# Patient Record
Sex: Male | Born: 1985 | Race: Black or African American | Hispanic: No | Marital: Single | Smoking: Current every day smoker
Health system: Southern US, Community
[De-identification: ages and names within clinical notes are randomized; demographics above are authoritative.]

## PROBLEM LIST (undated history)

## (undated) DIAGNOSIS — R569 Unspecified convulsions: Secondary | ICD-10-CM

## (undated) DIAGNOSIS — K7460 Unspecified cirrhosis of liver: Secondary | ICD-10-CM

## (undated) HISTORY — PX: FRACTURE SURGERY: SHX138

---

## 2005-05-24 ENCOUNTER — Emergency Department: Payer: Self-pay | Admitting: Internal Medicine

## 2005-10-09 ENCOUNTER — Emergency Department: Payer: Self-pay | Admitting: Emergency Medicine

## 2006-07-10 ENCOUNTER — Emergency Department: Payer: Self-pay | Admitting: Emergency Medicine

## 2006-09-27 ENCOUNTER — Emergency Department: Payer: Self-pay

## 2006-11-21 ENCOUNTER — Other Ambulatory Visit: Payer: Self-pay

## 2006-11-21 ENCOUNTER — Emergency Department: Payer: Self-pay | Admitting: General Practice

## 2007-03-31 ENCOUNTER — Emergency Department: Payer: Self-pay | Admitting: Emergency Medicine

## 2007-08-06 ENCOUNTER — Emergency Department: Payer: Self-pay | Admitting: Emergency Medicine

## 2007-08-12 ENCOUNTER — Ambulatory Visit: Payer: Self-pay | Admitting: Internal Medicine

## 2007-11-03 ENCOUNTER — Ambulatory Visit: Payer: Self-pay | Admitting: Internal Medicine

## 2008-07-20 ENCOUNTER — Emergency Department: Payer: Self-pay | Admitting: Emergency Medicine

## 2008-08-12 ENCOUNTER — Emergency Department: Payer: Self-pay | Admitting: Internal Medicine

## 2009-02-09 ENCOUNTER — Ambulatory Visit: Payer: Self-pay | Admitting: Internal Medicine

## 2009-03-15 ENCOUNTER — Ambulatory Visit: Payer: Self-pay | Admitting: Internal Medicine

## 2009-05-26 ENCOUNTER — Ambulatory Visit: Payer: Self-pay | Admitting: Internal Medicine

## 2009-12-21 ENCOUNTER — Emergency Department: Payer: Self-pay | Admitting: Emergency Medicine

## 2012-04-08 ENCOUNTER — Emergency Department: Payer: Self-pay | Admitting: Emergency Medicine

## 2014-10-04 ENCOUNTER — Encounter (HOSPITAL_COMMUNITY): Payer: Self-pay

## 2014-10-04 ENCOUNTER — Emergency Department (HOSPITAL_COMMUNITY)
Admission: EM | Admit: 2014-10-04 | Discharge: 2014-10-04 | Disposition: A | Discharge status: Home / Self Care | Payer: Medicaid Other | Attending: Emergency Medicine | Admitting: Emergency Medicine

## 2014-10-04 ENCOUNTER — Emergency Department (HOSPITAL_COMMUNITY): Payer: Medicaid Other

## 2014-10-04 DIAGNOSIS — R52 Pain, unspecified: Secondary | ICD-10-CM

## 2014-10-04 DIAGNOSIS — S60011A Contusion of right thumb without damage to nail, initial encounter: Secondary | ICD-10-CM | POA: Insufficient documentation

## 2014-10-04 DIAGNOSIS — Y998 Other external cause status: Secondary | ICD-10-CM | POA: Diagnosis not present

## 2014-10-04 DIAGNOSIS — S6991XA Unspecified injury of right wrist, hand and finger(s), initial encounter: Secondary | ICD-10-CM | POA: Diagnosis present

## 2014-10-04 DIAGNOSIS — Z791 Long term (current) use of non-steroidal anti-inflammatories (NSAID): Secondary | ICD-10-CM | POA: Diagnosis not present

## 2014-10-04 DIAGNOSIS — Y9289 Other specified places as the place of occurrence of the external cause: Secondary | ICD-10-CM | POA: Diagnosis not present

## 2014-10-04 DIAGNOSIS — Y9389 Activity, other specified: Secondary | ICD-10-CM | POA: Insufficient documentation

## 2014-10-04 DIAGNOSIS — Z8669 Personal history of other diseases of the nervous system and sense organs: Secondary | ICD-10-CM | POA: Diagnosis not present

## 2014-10-04 DIAGNOSIS — Z720 Tobacco use: Secondary | ICD-10-CM | POA: Diagnosis not present

## 2014-10-04 HISTORY — DX: Unspecified convulsions: R56.9

## 2014-10-04 MED ORDER — NAPROXEN 500 MG PO TABS: 500.0000 mg | ORAL_TABLET | Freq: Two times a day (BID) | ORAL | Status: DC

## 2014-10-04 MED ORDER — NAPROXEN 250 MG PO TABS: 500.0000 mg | ORAL_TABLET | Freq: Once | ORAL | Status: DC
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
## 2014-10-04 NOTE — ED Notes (Signed)
PA at bedside.
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
## 2014-10-04 NOTE — Discharge Instructions (Signed)
Contusion °A contusion is a deep bruise. Contusions are the result of an injury that caused bleeding under the skin. The contusion may turn blue, purple, or yellow. Minor injuries will give you a painless contusion, but more severe contusions may stay painful and swollen for a few weeks.  °CAUSES  °A contusion is usually caused by a blow, trauma, or direct force to an area of the body. °SYMPTOMS  °· Swelling and redness of the injured area. °· Bruising of the injured area. °· Tenderness and soreness of the injured area. °· Pain. °DIAGNOSIS  °The diagnosis can be made by taking a history and physical exam. An X-ray, CT scan, or MRI may be needed to determine if there were any associated injuries, such as fractures. °TREATMENT  °Specific treatment will depend on what area of the body was injured. In general, the best treatment for a contusion is resting, icing, elevating, and applying cold compresses to the injured area. Over-the-counter medicines may also be recommended for pain control. Ask your caregiver what the best treatment is for your contusion. °HOME CARE INSTRUCTIONS  °· Put ice on the injured area. °¨ Put ice in a plastic bag. °¨ Place a towel between your skin and the bag. °¨ Leave the ice on for 15-20 minutes, 3-4 times a day, or as directed by your health care provider. °· Only take over-the-counter or prescription medicines for pain, discomfort, or fever as directed by your caregiver. Your caregiver may recommend avoiding anti-inflammatory medicines (aspirin, ibuprofen, and naproxen) for 48 hours because these medicines may increase bruising. °· Rest the injured area. °· If possible, elevate the injured area to reduce swelling. °SEEK IMMEDIATE MEDICAL CARE IF:  °· You have increased bruising or swelling. °· You have pain that is getting worse. °· Your swelling or pain is not relieved with medicines. °MAKE SURE YOU:  °· Understand these instructions. °· Will watch your condition. °· Will get help right  away if you are not doing well or get worse. °Document Released: 07/24/2005 Document Revised: 10/19/2013 Document Reviewed: 08/19/2011 °ExitCare® Patient Information ©2015 [employer]. This information is not intended to replace advice given to you by your health care provider. Make sure you discuss any questions you have with your health care provider. ° °

## 2014-10-07 ENCOUNTER — Telehealth: Payer: Self-pay | Admitting: Orthopedic Surgery

## 2014-10-07 NOTE — Telephone Encounter (Signed)
Patient had called (10/06/14 p.m) and requested appointment following [facility] for a hand (thumb)injury.  Per review of insurance, patient requires referral from primary care provider -- [facility] card [facility].  Relayed this information to patient, and he was unaware of this office as his primary care.  We have followed up and verified that this is the practice on his insurance card, and have been given a "1-time referral" by their office until he is seen there as a patient, or is assigned by Medicaid to another provider.  Relayed this information to patient, although he did not voice understanding.  He has been scheduled for this problem accordingly, and is aware of appointment.

## 2014-10-09 ENCOUNTER — Encounter (HOSPITAL_COMMUNITY): Payer: Self-pay | Admitting: Emergency Medicine

## 2014-10-09 ENCOUNTER — Emergency Department (HOSPITAL_COMMUNITY)
Admission: EM | Admit: 2014-10-09 | Discharge: 2014-10-09 | Disposition: A | Discharge status: Home / Self Care | Payer: Medicaid Other | Attending: Emergency Medicine | Admitting: Emergency Medicine

## 2014-10-09 DIAGNOSIS — Z792 Long term (current) use of antibiotics: Secondary | ICD-10-CM | POA: Diagnosis not present

## 2014-10-09 DIAGNOSIS — K029 Dental caries, unspecified: Secondary | ICD-10-CM | POA: Insufficient documentation

## 2014-10-09 DIAGNOSIS — K088 Other specified disorders of teeth and supporting structures: Secondary | ICD-10-CM | POA: Diagnosis present

## 2014-10-09 DIAGNOSIS — Z791 Long term (current) use of non-steroidal anti-inflammatories (NSAID): Secondary | ICD-10-CM | POA: Insufficient documentation

## 2014-10-09 DIAGNOSIS — Z8669 Personal history of other diseases of the nervous system and sense organs: Secondary | ICD-10-CM | POA: Insufficient documentation

## 2014-10-09 DIAGNOSIS — Z720 Tobacco use: Secondary | ICD-10-CM | POA: Insufficient documentation

## 2014-10-09 MED ORDER — AMOXICILLIN 500 MG PO CAPS: 500.0000 mg | ORAL_CAPSULE | Freq: Three times a day (TID) | ORAL | Status: DC

## 2014-10-09 MED ORDER — TRAMADOL HCL 50 MG PO TABS: 50.0000 mg | ORAL_TABLET | Freq: Four times a day (QID) | ORAL | Status: DC | PRN

## 2014-10-09 NOTE — ED Provider Notes (Signed)
CSN: [id number]     Arrival date & time 10/09/14  1228 History  This chart was scribed for non-physician practitioner, Kerrie BuffaloHope Demani Mcbrien, PA-C,working with Vida RollerBrian D Miller, MD, by Karle PlumberJennifer Tensley, ED Scribe. This patient was seen in room APFT23/APFT23 and the patient's care was started at 2:09 PM.  Chief Complaint  Patient presents with  . Dental Pain   The history is provided by the patient. No language interpreter was used.    HPI Comments:  Lorenz CoasterJames A Fleishman is a 28 y.o. male who presents to the Emergency Department complaining of severe lower, right dental pain that has been ongoing for the past year. He reports the pain became worse yesterday. He has been applying Orajel with no significant relief of the pain. Eating makes the pain worse. Denies alleviating factors. Denies nausea, vomiting, abdominal pain, fever, chills or inability to swallow. PMH of seizures. Reports allergy to Ibuprofen with reaction of rash.  Past Medical History  Diagnosis Date  . Seizures    History reviewed. No pertinent past surgical history. History reviewed. No pertinent family history. History  Substance Use Topics  . Smoking status: Current Every Day Smoker -- 0.50 packs/day for 14 years    Types: Cigarettes  . Smokeless tobacco: Never Used  . Alcohol Use: Yes     Comment: occ    Review of Systems  Constitutional: Negative for fever and chills.  HENT: Positive for dental problem and facial swelling. Negative for trouble swallowing.   Gastrointestinal: Negative for nausea, vomiting and abdominal pain.  All other systems reviewed and are negative.   Allergies  Ibuprofen  Home Medications   Prior to Admission medications   Medication Sig Start Date End Date Taking? Authorizing Provider  amoxicillin (AMOXIL) 500 MG capsule Take 1 capsule (500 mg total) by mouth 3 (three) times daily. 10/09/14   Shilo Pauwels Orlene OchM Keaundra Stehle, NP  naproxen (NAPROSYN) 500 MG tablet Take 1 tablet (500 mg total) by mouth 2 (two) times  daily. Patient not taking: Reported on 10/09/2014 10/04/14   Burgess AmorJulie Idol, PA-C  traMADol (ULTRAM) 50 MG tablet Take 1 tablet (50 mg total) by mouth every 6 (six) hours as needed. 10/09/14   Lismary Kiehn Orlene OchM Tyla Burgner, NP   Triage Vitals: BP 123/74 mmHg  Pulse 64  Temp(Src) 98.7 F (37.1 C) (Oral)  Resp 14  Ht 5\' 6"  (1.676 m)  Wt 161 lb 6.4 oz (73.211 kg)  BMI 26.06 kg/m2  SpO2 100% Physical Exam  Constitutional: He is oriented to person, place, and time. He appears well-developed and well-nourished.  HENT:  Head: Atraumatic.  Mouth/Throat: Uvula is midline, oropharynx is clear and moist and mucous membranes are normal.  First right lower molar decayed to gumline. Surrounding erythema and edema to gingiva. Surrounding facial swelling.   Eyes: EOM are normal.  Neck: Neck supple.  Cardiovascular: Normal rate, regular rhythm and normal heart sounds.  Exam reveals no gallop and no friction rub.   No murmur heard. Pulmonary/Chest: Effort normal and breath sounds normal. No respiratory distress. He has no wheezes. He has no rales. He exhibits no tenderness.  Abdominal: Soft. Bowel sounds are normal. There is no tenderness.  Musculoskeletal: Normal range of motion.  Lymphadenopathy:    He has cervical adenopathy.  Neurological: He is alert and oriented to person, place, and time. No cranial nerve deficit.  Skin: Skin is warm and dry.  Nursing note and vitals reviewed.   ED Course  Procedures (including critical care time) DIAGNOSTIC STUDIES: Oxygen Saturation is  100% on RA, normal by my interpretation.   COORDINATION OF CARE: 2:12 PM- Will prescribe Tramadol and Amoxicillin. Will provide informatin about dental clinic. Pt verbalizes understanding and agrees to plan.  Medications - No data to display  Labs Review  MDM  28 y.o. male with dental pain that has been off and on for the past year. Stable for discharge without signs of sepsis. Discussed with the patient need for follow up with a  dentist as soon as possible for further evaluation and treatment of dental disease. Patient agrees with plan.    Medication List    TAKE these medications        amoxicillin 500 MG capsule  Commonly known as:  AMOXIL  Take 1 capsule (500 mg total) by mouth 3 (three) times daily.     traMADol 50 MG tablet  Commonly known as:  ULTRAM  Take 1 tablet (50 mg total) by mouth every 6 (six) hours as needed.      ASK your doctor about these medications        naproxen 500 MG tablet  Commonly known as:  NAPROSYN  Take 1 tablet (500 mg total) by mouth 2 (two) times daily.        Final diagnoses:  Pain due to dental caries   I personally performed the services described in this documentation, which was scribed in my presence. The recorded information has been reviewed and is accurate.    [id number]Neita Landrigan WaimanaloM Yvetta Drotar, NP 10/09/14 1717  Vida RollerBrian D Miller, MD 10/09/14 [phone]

## 2014-10-09 NOTE — ED Notes (Signed)
Patient c/o right lower dental pain that started yesterday. Denies taking any medication for pain. Per patient allergic to ibuprofen. Patient states "Pain was so bad I couldn't even sleep last night."

## 2014-10-09 NOTE — Discharge Instructions (Signed)
Dental Care and Dentist Visits Dental care supports good overall health. Regular dental visits can also help you avoid dental pain, bleeding, infection, and other more serious health problems in the future. It is important to keep the mouth healthy because diseases in the teeth, gums, and other oral tissues can spread to other areas of the body. Some problems, such as diabetes, heart disease, and pre-term labor have been associated with poor oral health.  See your dentist every 6 months. If you experience emergency problems such as a toothache or broken tooth, go to the dentist right away. If you see your dentist regularly, you may catch problems early. It is easier to be treated for problems in the early stages.  WHAT TO EXPECT AT A DENTIST VISIT  Your dentist will look for many common oral health problems and recommend proper treatment. At your regular dental visit, you can expect:  Gentle cleaning of the teeth and gums. This includes scraping and polishing. This helps to remove the sticky substance around the teeth and gums (plaque). Plaque forms in the mouth shortly after eating. Over time, plaque hardens on the teeth as tartar. If tartar is not removed regularly, it can cause problems. Cleaning also helps remove stains.  Periodic X-rays. These pictures of the teeth and supporting bone will help your dentist assess the health of your teeth.  Periodic fluoride treatments. Fluoride is a natural mineral shown to help strengthen teeth. Fluoride treatmentinvolves applying a fluoride gel or varnish to the teeth. It is most commonly done in children.  Examination of the mouth, tongue, jaws, teeth, and gums to look for any oral health problems, such as:  Cavities (dental caries). This is decay on the tooth caused by plaque, sugar, and acid in the mouth. It is best to catch a cavity when it is small.  Inflammation of the gums caused by plaque buildup (gingivitis).  Problems with the mouth or malformed  or misaligned teeth.  Oral cancer or other diseases of the soft tissues or jaws. KEEP YOUR TEETH AND GUMS HEALTHY For healthy teeth and gums, follow these general guidelines as well as your dentist's specific advice:  Have your teeth professionally cleaned at the dentist every 6 months.  Brush twice daily with a fluoride toothpaste.  Floss your teeth daily.  Ask your dentist if you need fluoride supplements, treatments, or fluoride toothpaste.  Eat a healthy diet. Reduce foods and drinks with added sugar.  Avoid smoking. TREATMENT FOR ORAL HEALTH PROBLEMS If you have oral health problems, treatment varies depending on the conditions present in your teeth and gums.  Your caregiver will most likely recommend good oral hygiene at each visit.  For cavities, gingivitis, or other oral health disease, your caregiver will perform a procedure to treat the problem. This is typically done at a separate appointment. Sometimes your caregiver will refer you to another dental specialist for specific tooth problems or for surgery. SEEK IMMEDIATE DENTAL CARE IF:  You have pain, bleeding, or soreness in the gum, tooth, jaw, or mouth area.  A permanent tooth becomes loose or separated from the gum socket.  You experience a blow or injury to the mouth or jaw area. Document Released: 06/26/2011 Document Revised: 01/06/2012 Document Reviewed: 06/26/2011 [facility] Patient Information 2015 [employer] SteinExitCare, [place]. This information is not intended to replace advice given to you by your health care provider. Make sure you discuss any questions you have with your health care provider.  Dental Caries Dental caries is tooth decay. This  decay can cause a hole in teeth (cavity) that can get bigger and deeper over time. HOME CARE  Brush and floss your teeth. Do this at least two times a day.  Use a fluoride toothpaste.  Use a mouth rinse if told by your dentist or doctor.  Eat less sugary and starchy foods.  Drink less sugary drinks.  Avoid snacking often on sugary and starchy foods. Avoid sipping often on sugary drinks.  Keep regular checkups and cleanings with your dentist.  Use fluoride supplements if told by your dentist or doctor.  Allow fluoride to be applied to teeth if told by your dentist or doctor. Document Released: 07/23/2008 Document Revised: 02/28/2014 Document Reviewed: 10/16/2012 [facility]Exit[facility] Patient Information 2015 [place]ExitCare, [place]LLC. This information is not intended to replace advice given to you by your health care provider. Make sure you discuss any questions you have with your health care provider.  Dental Pain A tooth ache may be caused by cavities (tooth decay). Cavities expose the nerve of the tooth to air and hot or cold temperatures. It may come from an infection or abscess (also called a boil or furuncle) around your tooth. It is also often caused by dental caries (tooth decay). This causes the pain you are having. DIAGNOSIS  Your caregiver can diagnose this problem by exam. TREATMENT   If caused by an infection, it may be treated with medications which kill germs (antibiotics) and pain medications as prescribed by your caregiver. Take medications as directed.  Only take over-the-counter or prescription medicines for pain, discomfort, or fever as directed by your caregiver.  Whether the tooth ache today is caused by infection or dental disease, you should see your dentist as soon as possible for further care. SEEK MEDICAL CARE IF: The exam and treatment you received today has been provided on an emergency basis only. This is not a substitute for complete medical or dental care. If your problem worsens or new problems (symptoms) appear, and you are unable to meet with your dentist, call or return to this location. SEEK IMMEDIATE MEDICAL CARE IF:   You have a fever.  You develop redness and swelling of your face, jaw, or neck.  You are unable to open your mouth.  You  have severe pain uncontrolled by pain medicine. MAKE SURE YOU:   Understand these instructions.  Will watch your condition.  Will get help right away if you are not doing well or get worse. Document Released: 10/14/2005 Document Revised: 01/06/2012 Document Reviewed: 06/01/2008 [facility] PaExitCare Patient Information 2015 [place]ExitCare, [place]LLC. This information is not intended to replace advice given to you by your health care provider. Make sure you discuss any questions you have with your health care provider.

## 2014-10-10 ENCOUNTER — Ambulatory Visit: Payer: Medicaid Other | Admitting: Orthopedic Surgery

## 2014-10-11 NOTE — Telephone Encounter (Signed)
Patient has missed the scheduled appointment on 10/10/14; letter has been sent

## 2014-11-07 ENCOUNTER — Encounter (HOSPITAL_COMMUNITY): Payer: Self-pay | Admitting: *Deleted

## 2014-11-07 ENCOUNTER — Emergency Department (HOSPITAL_COMMUNITY)
Admission: EM | Admit: 2014-11-07 | Discharge: 2014-11-07 | Disposition: A | Discharge status: Home / Self Care | Payer: Medicaid Other | Attending: Emergency Medicine | Admitting: Emergency Medicine

## 2014-11-07 DIAGNOSIS — Y998 Other external cause status: Secondary | ICD-10-CM | POA: Diagnosis not present

## 2014-11-07 DIAGNOSIS — W260XXA Contact with knife, initial encounter: Secondary | ICD-10-CM | POA: Diagnosis not present

## 2014-11-07 DIAGNOSIS — Y93G1 Activity, food preparation and clean up: Secondary | ICD-10-CM | POA: Insufficient documentation

## 2014-11-07 DIAGNOSIS — S61216A Laceration without foreign body of right little finger without damage to nail, initial encounter: Secondary | ICD-10-CM | POA: Diagnosis present

## 2014-11-07 DIAGNOSIS — Z792 Long term (current) use of antibiotics: Secondary | ICD-10-CM | POA: Insufficient documentation

## 2014-11-07 DIAGNOSIS — Y9289 Other specified places as the place of occurrence of the external cause: Secondary | ICD-10-CM | POA: Diagnosis not present

## 2014-11-07 DIAGNOSIS — S61219A Laceration without foreign body of unspecified finger without damage to nail, initial encounter: Secondary | ICD-10-CM

## 2014-11-07 DIAGNOSIS — Z720 Tobacco use: Secondary | ICD-10-CM | POA: Insufficient documentation

## 2014-11-07 MED ORDER — TETANUS-DIPHTH-ACELL PERTUSSIS 5-2.5-18.5 LF-MCG/0.5 IM SUSP
0.5000 mL | Freq: Once | INTRAMUSCULAR | Status: AC
  Administered 2014-11-07: 0.5 mL via INTRAMUSCULAR
  Filled 2014-11-07: qty 0.5

## 2014-11-07 NOTE — ED Provider Notes (Signed)
CSN: [id number]     Arrival date & time 11/07/14  1205 History  This chart was scribed for non-physician practitioner, Pauline Aus, PA-C, working with Vanetta Mulders, MD, by Ronney Lion, ED Scribe. This patient was seen in room APFT22/APFT22 and the patient's care was started at 2:31 PM.    Chief Complaint  Patient presents with  . Extremity Laceration   The history is provided by the patient. No language interpreter was used.     HPI Comments: Javier Glover is a 29 y.o. male who presents to the Emergency Department for a right fifth finger laceration from a knife that occurred when patient was washing dishes. He states he can't remember his last tetanus shot, although it was probably over 5 years ago. He denies pain,  States the wound opens up with finger movement .  He denies numbness or weakness of his finger.    Past Medical History  Diagnosis Date  . Seizures    Past Surgical History  Procedure Laterality Date  . Fracture surgery     History reviewed. No pertinent family history. History  Substance Use Topics  . Smoking status: Current Every Day Smoker -- 0.50 packs/day for 14 years    Types: Cigarettes  . Smokeless tobacco: Never Used  . Alcohol Use: Yes     Comment: occ    Review of Systems  Constitutional: Negative for fever and chills.  Musculoskeletal: Negative for back pain, joint swelling and arthralgias.  Skin: Positive for wound.       Laceration   Neurological: Negative for dizziness, weakness and numbness.  Hematological: Does not bruise/bleed easily.  All other systems reviewed and are negative.     Allergies  Ibuprofen  Home Medications   Prior to Admission medications   Medication Sig Start Date End Date Taking? Authorizing Provider  amoxicillin (AMOXIL) 500 MG capsule Take 1 capsule (500 mg total) by mouth 3 (three) times daily. 10/09/14   Hope Orlene Och, NP  naproxen (NAPROSYN) 500 MG tablet Take 1 tablet (500 mg total) by mouth 2 (two) times  daily. Patient not taking: Reported on 10/09/2014 10/04/14   Burgess Amor, PA-C  traMADol (ULTRAM) 50 MG tablet Take 1 tablet (50 mg total) by mouth every 6 (six) hours as needed. 10/09/14   Hope Orlene Och, NP   BP 119/73 mmHg  Pulse 97  Temp(Src) 97.7 F (36.5 C) (Oral)  Resp 14  Ht 6' (1.829 m)  Wt 163 lb (73.936 kg)  BMI 22.10 kg/m2  SpO2 98% Physical Exam  Constitutional: He is oriented to person, place, and time. He appears well-developed and well-nourished. No distress.  HENT:  Head: Normocephalic and atraumatic.  Eyes: Conjunctivae and EOM are normal.  Neck: Neck supple. No tracheal deviation present.  Cardiovascular: Normal rate, regular rhythm, normal heart sounds and intact distal pulses.   No murmur heard. Pulmonary/Chest: Effort normal and breath sounds normal. No respiratory distress.  Musculoskeletal: Normal range of motion. He exhibits no edema or tenderness.  Full ROM of the finger.  Neurological: He is alert and oriented to person, place, and time. He exhibits normal muscle tone. Coordination normal.  Sensation intact.  Skin: Skin is warm and dry. Laceration noted.  1 cm laceration to the distal right fifth finger. Bleeding controlled. No injury of the deep structures seen.  Psychiatric: He has a normal mood and affect. His behavior is normal.  Nursing note and vitals reviewed.   ED Course  Procedures (including critical care time)  DIAGNOSTIC STUDIES: Oxygen Saturation is 98% on room air, normal by my interpretation.    COORDINATION OF CARE: 2:32 PM - Discussed treatment plan with pt at bedside which includes Dermabond, tetanus shot, and home care instructions, and pt agreed to plan.  LACERATION REPAIR PROCEDURE NOTE The patient's identification was confirmed and consent was obtained. This procedure was performed by Pauline Ausammy Demetric Parslow, PA-C, working with Vanetta MuldersScott Zackowski, MD at 2:37 PM. Site: Right distal pinky Sterile procedures observed Anesthetic used (type  and amt): None Length: 1 cm Technique: Dermabond applied topically Tetanus ordered Site anesthetized, irrigated with NS, explored without evidence of foreign body, wound well approximated, site covered with dry, sterile dressing.  Patient tolerated procedure well without complications. Instructions for care discussed verbally and patient provided with additional written instructions for homecare and f/u.   MDM   Final diagnoses:  Laceration of finger, initial encounter    superficial lac to the distal right fifth finger.  Remains NV intact.  Wound cleaned and closed with tissue adhesive. Pt agrees to return here if needed.  Wound care instr given.    I personally performed the services described in this documentation, which was scribed in my presence. The recorded information has been reviewed and is accurate.   Sakiya Stepka L. Rowe Robertriplett, PA-C 11/08/14 2117  Vanetta MuldersScott Zackowski, MD 11/09/14 [phone]

## 2014-11-07 NOTE — ED Notes (Signed)
Lac to rt 5th finger, on knife when washing dishes.

## 2014-11-07 NOTE — ED Notes (Signed)
Pt with lac to right pinky finger while washing dishes on a knife today, states that he was seen for right thumb pain and had splint to it, splint not in place at time pt arrived to room

## 2016-03-25 DIAGNOSIS — R45851 Suicidal ideations: Secondary | ICD-10-CM | POA: Insufficient documentation

## 2016-03-29 DIAGNOSIS — F1994 Other psychoactive substance use, unspecified with psychoactive substance-induced mood disorder: Secondary | ICD-10-CM | POA: Insufficient documentation

## 2016-03-29 DIAGNOSIS — Z72811 Adult antisocial behavior: Secondary | ICD-10-CM | POA: Insufficient documentation

## 2017-12-24 DIAGNOSIS — Z765 Malingerer [conscious simulation]: Secondary | ICD-10-CM | POA: Insufficient documentation

## 2018-05-17 ENCOUNTER — Other Ambulatory Visit: Payer: Self-pay

## 2018-05-17 ENCOUNTER — Emergency Department: Payer: Medicaid Other

## 2018-05-17 ENCOUNTER — Emergency Department
Admission: EM | Admit: 2018-05-17 | Discharge: 2018-05-17 | Disposition: A | Discharge status: Home / Self Care | Payer: Medicaid Other | Attending: Emergency Medicine | Admitting: Emergency Medicine

## 2018-05-17 ENCOUNTER — Encounter: Payer: Self-pay | Admitting: Emergency Medicine

## 2018-05-17 DIAGNOSIS — Y939 Activity, unspecified: Secondary | ICD-10-CM | POA: Diagnosis not present

## 2018-05-17 DIAGNOSIS — S01511A Laceration without foreign body of lip, initial encounter: Secondary | ICD-10-CM | POA: Diagnosis not present

## 2018-05-17 DIAGNOSIS — F1210 Cannabis abuse, uncomplicated: Secondary | ICD-10-CM

## 2018-05-17 DIAGNOSIS — Y999 Unspecified external cause status: Secondary | ICD-10-CM | POA: Insufficient documentation

## 2018-05-17 DIAGNOSIS — F17210 Nicotine dependence, cigarettes, uncomplicated: Secondary | ICD-10-CM | POA: Diagnosis not present

## 2018-05-17 DIAGNOSIS — R45851 Suicidal ideations: Secondary | ICD-10-CM | POA: Diagnosis not present

## 2018-05-17 DIAGNOSIS — Z0471 Encounter for examination and observation following alleged adult physical abuse: Secondary | ICD-10-CM | POA: Diagnosis present

## 2018-05-17 DIAGNOSIS — Y92009 Unspecified place in unspecified non-institutional (private) residence as the place of occurrence of the external cause: Secondary | ICD-10-CM | POA: Diagnosis not present

## 2018-05-17 DIAGNOSIS — F1410 Cocaine abuse, uncomplicated: Secondary | ICD-10-CM | POA: Insufficient documentation

## 2018-05-17 DIAGNOSIS — F10920 Alcohol use, unspecified with intoxication, uncomplicated: Secondary | ICD-10-CM | POA: Insufficient documentation

## 2018-05-17 LAB — ETHANOL: ALCOHOL ETHYL (B): 112 mg/dL — AB (ref <10)

## 2018-05-17 LAB — URINE DRUG SCREEN, QUALITATIVE (ARMC ONLY)
AMPHETAMINES, UR SCREEN: NOT DETECTED
Benzodiazepine, Ur Scrn: NOT DETECTED
Cannabinoid 50 Ng, Ur ~~LOC~~: POSITIVE — AB
Cocaine Metabolite,Ur ~~LOC~~: POSITIVE — AB
MDMA (Ecstasy)Ur Screen: NOT DETECTED
METHADONE SCREEN, URINE: NOT DETECTED
Opiate, Ur Screen: NOT DETECTED
Phencyclidine (PCP) Ur S: NOT DETECTED
Tricyclic, Ur Screen: NOT DETECTED

## 2018-05-17 LAB — CBC WITH DIFFERENTIAL/PLATELET
Basophils Absolute: 0 10*3/uL (ref 0–0.1)
Basophils Relative: 0 %
EOS ABS: 0 10*3/uL (ref 0–0.7)
Eosinophils Relative: 0 %
HCT: 36 % — ABNORMAL LOW (ref 40.0–52.0)
Hemoglobin: 12.2 g/dL — ABNORMAL LOW (ref 13.0–18.0)
Lymphocytes Relative: 18 %
Lymphs Abs: 1.7 10*3/uL (ref 1.0–3.6)
MCH: 26.9 pg (ref 26.0–34.0)
MCHC: 33.9 g/dL (ref 32.0–36.0)
MCV: 79.2 fL — ABNORMAL LOW (ref 80.0–100.0)
MONO ABS: 0.8 10*3/uL (ref 0.2–1.0)
Monocytes Relative: 9 %
Neutro Abs: 6.6 10*3/uL — ABNORMAL HIGH (ref 1.4–6.5)
Neutrophils Relative %: 73 %
Platelets: 265 10*3/uL (ref 150–440)
RBC: 4.55 MIL/uL (ref 4.40–5.90)
RDW: 13.9 % (ref 11.5–14.5)
WBC: 9.1 10*3/uL (ref 3.8–10.6)

## 2018-05-17 LAB — COMPREHENSIVE METABOLIC PANEL
ALT: 15 U/L (ref 0–44)
AST: 17 U/L (ref 15–41)
Albumin: 4.7 g/dL (ref 3.5–5.0)
Alkaline Phosphatase: 47 U/L (ref 38–126)
Anion gap: 15 (ref 5–15)
BILIRUBIN TOTAL: 0.5 mg/dL (ref 0.3–1.2)
BUN: 12 mg/dL (ref 6–20)
CALCIUM: 9.4 mg/dL (ref 8.9–10.3)
CO2: 20 mmol/L — ABNORMAL LOW (ref 22–32)
Chloride: 106 mmol/L (ref 98–111)
Creatinine, Ser: 1.04 mg/dL (ref 0.61–1.24)
GFR calc non Af Amer: >60 mL/min (ref >60)
Glucose, Bld: 110 mg/dL — ABNORMAL HIGH (ref 70–99)
POTASSIUM: 3.1 mmol/L — AB (ref 3.5–5.1)
Sodium: 141 mmol/L (ref 135–145)
TOTAL PROTEIN: 8.5 g/dL — AB (ref 6.5–8.1)

## 2018-05-17 MED ORDER — LORAZEPAM 2 MG/ML IJ SOLN
1.0000 mg | Freq: Once | INTRAMUSCULAR | Status: AC
  Administered 2018-05-17: 1 mg via INTRAVENOUS
  Filled 2018-05-17: qty 1

## 2018-05-17 MED ORDER — SODIUM CHLORIDE 0.9 % IV BOLUS
1000.0000 mL | Freq: Once | INTRAVENOUS | Status: AC
  Administered 2018-05-17: 1000 mL via INTRAVENOUS

## 2018-05-17 NOTE — ED Notes (Signed)
Report called to Aon CorporationDorothy RN and pt transferred to rm #[id number]

## 2018-05-17 NOTE — Clinical Social Work Note (Signed)
Clinical Social Work Assessment  Patient Details  Name: Javier Glover MRN: [id number] Date of Birth: [birth year]  Date of referral:  05/17/18               Reason for consult:  [facility], Discharge Planning                Permission sought to share information with:    Permission granted to share information::  No  Name::     Patient will call his friends and family himself  Agency::     Relationship::     Contact Information:     Housing/Transportation Living arrangements for the past 2 months:  No permanent address Source of Information:  Patient Patient Interpreter Needed:  None Criminal Activity/Legal Involvement Pertinent to Current Situation/Hospitalization:  No - Comment as needed Significant Relationships:  None Lives with:  Self Do you feel safe going back to the place where you live?  Yes Need for family participation in patient care:  No (Coment)  Care giving concerns:  None   Office managerocial Worker assessment / plan:  LCSW introduced myself to patient and explained that he will be discharged shortly. He was guarded and not forth coming with information. I asked patient if I could provide him with shelter , rehab and out patient resources. He agreed but demanded the phone. I explained that [facility] might be a safer housing option, patient declined shelter assistance and "will Call my Own People". Patient is making his own transportation and housing arrangements and now demanding to leave asap. Handouts provided and 1-1 support no further SW needs. Patient reports he is not suicidal or homicidal. In consultation with EDP and SOC the patient is OK for discharge. Patient will remain in lobby until his ride arrives. [facility] [employer] resource, [facility], Community education officerTSA and Other generic resource list was provided. No further needs  Employment status:  Unemployed Insurance information:  Self Pay (Medicaid Pending) PT Recommendations:    Information / Referral to community  resources:   none  Patient/Family's Response to care:  none  Patient/Family's Understanding of and Emotional Response to Diagnosis, Current Treatment, and Prognosis: None  Emotional Assessment Appearance:  Appears stated age Attitude/Demeanor/Rapport:  Avoidant, Guarded Affect (typically observed):  Guarded, Defensive Orientation:  Oriented to Self, Oriented to Place, Oriented to  Time, Oriented to Situation Alcohol / Substance use:  Illicit Drugs, Alcohol Use Psych involvement (Current and /or in the community):  No (Comment)  Discharge Needs  Concerns to be addressed:  Patient refuses services Readmission within the last 30 days:  No Current discharge risk:  None, Substance Abuse Barriers to Discharge:  Active Substance Use   Cheron SchaumannBandi, Sapphire Tygart M, LCSW 05/17/2018, 2:18 PM

## 2018-05-17 NOTE — BH Assessment (Signed)
TTS attempted assessment however pt was intoxicated on ETOH and could not be aroused. This Clinical research associatewriter as well as the [employer] tried to wake the pt however pt was unresponsive. Per pt's nurse pt stated "my life is f*cked, if I had a gun I would kill myself". TTS will return when pt can safely participate in assessment. Pt has been IVC'd.

## 2018-05-17 NOTE — ED Notes (Signed)
Attempted to arouse pt and he refuses to wake up - does open eyes and then falls back to sleep
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
## 2018-05-17 NOTE — ED Notes (Signed)
Pt given all his belongings. Discharge teaching done and pt verbalized understanding. Pt signed discharge form. Discharged to lobby in NAD, ambulatory.
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
## 2018-09-27 ENCOUNTER — Other Ambulatory Visit: Payer: Self-pay

## 2018-09-27 ENCOUNTER — Emergency Department
Admission: EM | Admit: 2018-09-27 | Discharge: 2018-09-27 | Disposition: A | Discharge status: Home / Self Care | Payer: Medicaid Other | Attending: Emergency Medicine | Admitting: Emergency Medicine

## 2018-09-27 ENCOUNTER — Encounter: Payer: Self-pay | Admitting: Emergency Medicine

## 2018-09-27 ENCOUNTER — Emergency Department: Payer: Medicaid Other

## 2018-09-27 DIAGNOSIS — Y929 Unspecified place or not applicable: Secondary | ICD-10-CM | POA: Insufficient documentation

## 2018-09-27 DIAGNOSIS — W1840XA Slipping, tripping and stumbling without falling, unspecified, initial encounter: Secondary | ICD-10-CM | POA: Insufficient documentation

## 2018-09-27 DIAGNOSIS — Y999 Unspecified external cause status: Secondary | ICD-10-CM | POA: Insufficient documentation

## 2018-09-27 DIAGNOSIS — F17210 Nicotine dependence, cigarettes, uncomplicated: Secondary | ICD-10-CM | POA: Diagnosis not present

## 2018-09-27 DIAGNOSIS — S82892A Other fracture of left lower leg, initial encounter for closed fracture: Secondary | ICD-10-CM

## 2018-09-27 DIAGNOSIS — S8262XA Displaced fracture of lateral malleolus of left fibula, initial encounter for closed fracture: Secondary | ICD-10-CM | POA: Diagnosis not present

## 2018-09-27 DIAGNOSIS — S99912A Unspecified injury of left ankle, initial encounter: Secondary | ICD-10-CM | POA: Diagnosis present

## 2018-09-27 DIAGNOSIS — Y9301 Activity, walking, marching and hiking: Secondary | ICD-10-CM | POA: Insufficient documentation

## 2018-09-27 MED ORDER — FENTANYL CITRATE (PF) 100 MCG/2ML IJ SOLN
100.0000 ug | Freq: Once | INTRAMUSCULAR | Status: AC
  Administered 2018-09-27: 100 ug via NASAL
  Filled 2018-09-27: qty 2

## 2018-09-27 MED ORDER — HYDROCODONE-ACETAMINOPHEN 5-325 MG PO TABS: 1.0000 | ORAL_TABLET | Freq: Four times a day (QID) | ORAL | 0 refills | Status: DC | PRN

## 2018-09-27 NOTE — ED Provider Notes (Addendum)
[facility] Emergency Department Provider Note  ____________________________________________   First MD Initiated Contact with Patient 09/27/18 [phone]     (approximate)  I have reviewed the triage vital signs and the nursing notes.   HISTORY  Chief Complaint Ankle Pain  History is limited by the patient's intoxication  HPI Javier Glover is a 32 y.o. male who self presents to the emergency department with sudden onset severe left ankle pain that began this evening when he was walking turned and rolled his ankle.  He has had pain ever since.  He has been bearing weight although with difficulty.  No numbness weakness.  He did not hit his head.  Denies double vision blurred vision chest pain or shortness of breath.    Past Medical History:  Diagnosis Date  . Seizures (HCC)     There are no active problems to display for this patient.   Past Surgical History:  Procedure Laterality Date  . FRACTURE SURGERY      Prior to Admission medications   Medication Sig Start Date End Date Taking? Authorizing Provider  amoxicillin (AMOXIL) 500 MG capsule Take 1 capsule (500 mg total) by mouth 3 (three) times daily. Patient not taking: Reported on 11/07/2014 10/09/14   Janne Napoleon, NP  HYDROcodone-acetaminophen (NORCO) 5-325 MG tablet Take 1 tablet by mouth every 6 (six) hours as needed for up to 7 doses for severe pain. 09/27/18   Merrily Brittle, MD  naproxen (NAPROSYN) 500 MG tablet Take 1 tablet (500 mg total) by mouth 2 (two) times daily. Patient not taking: Reported on 10/09/2014 10/04/14   Burgess Amor, PA-C  traMADol (ULTRAM) 50 MG tablet Take 1 tablet (50 mg total) by mouth every 6 (six) hours as needed. Patient not taking: Reported on 11/07/2014 10/09/14   Janne Napoleon, NP    Allergies Ibuprofen  No family history on file.  Social History Social History   Tobacco Use  . Smoking status: Current Every Day Smoker    Packs/day: 0.50    Years: 14.00    Pack years: 7.00    Types: Cigarettes  . Smokeless tobacco: Never Used  Substance Use Topics  . Alcohol use: Yes    Comment: occ  . Drug use: Yes    Review of Systems Constitutional: No fever/chills Cardiovascular: Denies chest pain. Respiratory: Denies shortness of breath. Gastrointestinal: No abdominal pain.  No nausea, no vomiting.   Musculoskeletal: Positive for ankle pain Neurological: Negative for headaches   ____________________________________________   PHYSICAL EXAM:  VITAL SIGNS: ED Triage Vitals  Enc Vitals Group     BP 09/27/18 0302 114/77     Pulse Rate 09/27/18 0302 (!) 102     Resp 09/27/18 0302 (!) 26     Temp 09/27/18 0302 98.1 F (36.7 C)     Temp Source 09/27/18 0302 Oral     SpO2 09/27/18 0302 100 %     Weight 09/27/18 0255 190 lb (86.2 kg)     Height 09/27/18 0255 5\' 10"  (1.778 m)     Head Circumference --      Peak Flow --      Pain Score 09/27/18 0255 10     Pain Loc --      Pain Edu? --      Excl. in GC? --     Constitutional: Alert and oriented x4 appears somewhat intoxicated with loud and erratic behavior Head: Atraumatic.  Pupils are midrange and brisk Cardiovascular: Regular rate and  rhythm Respiratory: Normal respiratory effort.  No retractions. MSK: Tender over both the proximal and or for 6 cm medial malleolus and lateral malleolus No tenderness over navicular, midfoot, or fifth metatarsal 2+ dorsalis pedis pulse Skin closed Compartments soft Patient can fire extensor hallucis longus, extensor digitorum longus, flexor hallucis longus, flexor digitorum longus, tibialis anterior, and gastrocnemius Sensation intact to light touch to sural, saphenous, deep peroneal, superficial peroneal, and tibial nerve  Neurologic:  Normal speech and language. No gross focal neurologic deficits are appreciated.  Skin:  Skin is warm, dry and intact. No rash noted.    ____________________________________________  LABS (all labs ordered are  listed, but only abnormal results are displayed)  Labs Reviewed - No data to display   __________________________________________  EKG   ____________________________________________  RADIOLOGY  X-ray of the left ankle reviewed by me consistent with acute ankle fracture ____________________________________________   DIFFERENTIAL includes but not limited to  Ankle fracture, ankle sprain, ankle dislocation, maissoneuve fracture   PROCEDURES  Procedure(s) performed: Yes  .Splint Application Date/Time: 09/27/2018 4:43 AM Performed by: Merrily Brittleifenbark, Lexey Fletes, MD Authorized by: Merrily Brittleifenbark, Jasmine Mcbeth, MD   Consent:    Consent obtained:  Verbal   Consent given by:  Patient   Risks discussed:  Pain and swelling   Alternatives discussed:  Alternative treatment Pre-procedure details:    Sensation:  Normal Procedure details:    Laterality:  Left   Location:  Ankle   Ankle:  L ankle   Strapping: no     Splint type:  Short leg and ankle stirrup   Supplies:  Ortho-Glass Post-procedure details:    Pain:  Improved   Sensation:  Normal   Patient tolerance of procedure:  Tolerated well, no immediate complications    Critical Care performed: no  ____________________________________________   INITIAL IMPRESSION / ASSESSMENT AND PLAN / ED COURSE  Pertinent labs & imaging results that were available during my care of the patient were reviewed by me and considered in my medical decision making (see chart for details).   As part of my medical decision making, I reviewed the following data within the electronic MEDICAL RECORD NUMBER History obtained from family if available, nursing notes, old chart and ekg, as well as notes from prior ED visits.  The patient gives a clear story for rolling his ankle in a mechanical fall.  X-ray confirms acute fracture.  He is neurovascularly intact and compartments are soft.  Splinted and given crutches.  Initially given 100 mcg of intranasal fentanyl for pain  control prior to splinting which was effective in adequately treated his pain.  Will refer him to orthopedic surgery as an outpatient.  Strict return precautions have been given.  He is not driving.  He is stable on his crutches.  He understands to remain nonweightbearing.      ____________________________________________   FINAL CLINICAL IMPRESSION(S) / ED DIAGNOSES  Final diagnoses:  Closed fracture of left ankle, initial encounter      NEW MEDICATIONS STARTED DURING THIS VISIT:  Discharge Medication List as of 09/27/2018  4:43 AM    START taking these medications   Details  HYDROcodone-acetaminophen (NORCO) 5-325 MG tablet Take 1 tablet by mouth every 6 (six) hours as needed for up to 7 doses for severe pain., Starting Sun 09/27/2018, Print         Note:  This document was prepared using Dragon voice recognition software and may include unintentional dictation errors.      Merrily Brittleifenbark, Evalynn Hankins, MD 10/01/18 [phone]  Merrily Brittle, MD 10/01/18 [phone]

## 2018-09-27 NOTE — Discharge Instructions (Signed)
DO NOT PUT ANY WEIGHT ON YOUR LEFT FOOT UNTIL YOU ARE CLEARED BY THE ORTHOPEDIC SURGEON  Please take your pain medication as needed for severe symptoms and follow up in clinic this week.  Return to the ED sooner for any concerns.  It was a pleasure to take care of you today, and thank you for coming to our emergency department.  If you have any questions or concerns before leaving please ask the nurse to grab me and I'm more than happy to go through your aftercare instructions again.  If you were prescribed any opioid pain medication today such as Norco, Vicodin, Percocet, morphine, hydrocodone, or oxycodone please make sure you do not drive when you are taking this medication as it can alter your ability to drive safely.  If you have any concerns once you are home that you are not improving or are in fact getting worse before you can make it to your follow-up appointment, please do not hesitate to call 911 and come back for further evaluation.  Merrily BrittleNeil Ismael Karge, MD  Results for orders placed or performed during the hospital encounter of 05/17/18  CBC with Differential  Result Value Ref Range   WBC 9.1 3.8 - 10.6 K/uL   RBC 4.55 4.40 - 5.90 MIL/uL   Hemoglobin 12.2 (L) 13.0 - 18.0 g/dL   HCT 16.136.0 (L) 09.640.0 - 04.552.0 %   MCV 79.2 (L) 80.0 - 100.0 fL   MCH 26.9 26.0 - 34.0 pg   MCHC 33.9 32.0 - 36.0 g/dL   RDW 40.913.9 81.111.5 - 91.414.5 %   Platelets 265 150 - 440 K/uL   Neutrophils Relative % 73 %   Neutro Abs 6.6 (H) 1.4 - 6.5 K/uL   Lymphocytes Relative 18 %   Lymphs Abs 1.7 1.0 - 3.6 K/uL   Monocytes Relative 9 %   Monocytes Absolute 0.8 0.2 - 1.0 K/uL   Eosinophils Relative 0 %   Eosinophils Absolute 0.0 0 - 0.7 K/uL   Basophils Relative 0 %   Basophils Absolute 0.0 0 - 0.1 K/uL  Comprehensive metabolic panel  Result Value Ref Range   Sodium 141 135 - 145 mmol/L   Potassium 3.1 (L) 3.5 - 5.1 mmol/L   Chloride 106 98 - 111 mmol/L   CO2 20 (L) 22 - 32 mmol/L   Glucose, Bld 110 (H) 70 - 99  mg/dL   BUN 12 6 - 20 mg/dL   Creatinine, Ser 7.821.04 0.61 - 1.24 mg/dL   Calcium 9.4 8.9 - 95.610.3 mg/dL   Total Protein 8.5 (H) 6.5 - 8.1 g/dL   Albumin 4.7 3.5 - 5.0 g/dL   AST 17 15 - 41 U/L   ALT 15 0 - 44 U/L   Alkaline Phosphatase 47 38 - 126 U/L   Total Bilirubin 0.5 0.3 - 1.2 mg/dL   GFR calc non Af Amer >60 >60 mL/min   GFR calc Af Amer >60 >60 mL/min   Anion gap 15 5 - 15  Ethanol  Result Value Ref Range   Alcohol, Ethyl (B) 112 (H) <10 mg/dL  Urine Drug Screen, Qualitative  Result Value Ref Range   Tricyclic, Ur Screen NONE DETECTED NONE DETECTED   Amphetamines, Ur Screen NONE DETECTED NONE DETECTED   MDMA (Ecstasy)Ur Screen NONE DETECTED NONE DETECTED   Cocaine Metabolite,Ur [place] POSITIVE (A) NONE DETECTED   Opiate, Ur Screen NONE DETECTED NONE DETECTED   Phencyclidine (PCP) Ur S NONE DETECTED NONE DETECTED   Cannabinoid 50 Ng, Ur  [place] POSITIVE (A) NONE DETECTED   Barbiturates, Ur Screen (A) NONE DETECTED    Result not available. Reagent lot number recalled by manufacturer.   Benzodiazepine, Ur Scrn NONE DETECTED NONE DETECTED   Methadone Scn, Ur NONE DETECTED NONE DETECTED   Dg Ankle Complete Left  Result Date: 09/27/2018 CLINICAL DATA:  Left ankle pain after fall at home.  Swelling. EXAM: LEFT ANKLE COMPLETE - 3+ VIEW COMPARISON:  None. FINDINGS: Oblique mildly displaced distal fibular fracture proximal to the ankle mortise. Associated lateral soft tissue edema. No additional acute fracture of the ankle. The ankle mortise is preserved. IMPRESSION: Oblique mildly displaced distal fibular fracture just proximal to the ankle mortise. Electronically Signed   By: Narda Rutherford M.D.   On: 09/27/2018 03:40

## 2018-09-27 NOTE — ED Triage Notes (Signed)
Pt arrives POV with spouse and friend who will come back to get him. Pt reports that he fell at home and twisted his ankle. Pt is in NAD.

## 2018-09-27 NOTE — ED Notes (Signed)
Pt states that he fell on his left ankle tonight and its now hurting him. Ankle is swollen and painful. Pt is alert and oriented x 4. Family at bedside.

## 2018-10-19 ENCOUNTER — Ambulatory Visit: Payer: Medicaid Other

## 2018-10-19 ENCOUNTER — Other Ambulatory Visit: Payer: Self-pay | Admitting: Sports Medicine

## 2018-10-19 DIAGNOSIS — M25572 Pain in left ankle and joints of left foot: Secondary | ICD-10-CM

## 2018-10-20 ENCOUNTER — Ambulatory Visit: Payer: Medicaid Other | Attending: Sports Medicine

## 2018-11-15 ENCOUNTER — Other Ambulatory Visit: Payer: Self-pay

## 2018-11-15 ENCOUNTER — Emergency Department
Admission: EM | Admit: 2018-11-15 | Discharge: 2018-11-17 | Disposition: A | Discharge status: Other Acute Inpatient Hospital | Payer: Medicaid Other | Attending: Emergency Medicine | Admitting: Emergency Medicine

## 2018-11-15 DIAGNOSIS — Y907 Blood alcohol level of 200-239 mg/100 ml: Secondary | ICD-10-CM | POA: Insufficient documentation

## 2018-11-15 DIAGNOSIS — R45851 Suicidal ideations: Secondary | ICD-10-CM | POA: Insufficient documentation

## 2018-11-15 DIAGNOSIS — F14922 Cocaine use, unspecified with intoxication with perceptual disturbance: Secondary | ICD-10-CM

## 2018-11-15 DIAGNOSIS — F10921 Alcohol use, unspecified with intoxication delirium: Secondary | ICD-10-CM | POA: Diagnosis not present

## 2018-11-15 DIAGNOSIS — F329 Major depressive disorder, single episode, unspecified: Secondary | ICD-10-CM | POA: Diagnosis present

## 2018-11-15 DIAGNOSIS — F1410 Cocaine abuse, uncomplicated: Secondary | ICD-10-CM | POA: Diagnosis not present

## 2018-11-15 DIAGNOSIS — F15922 Other stimulant use, unspecified with intoxication with perceptual disturbance: Secondary | ICD-10-CM | POA: Insufficient documentation

## 2018-11-15 DIAGNOSIS — F17210 Nicotine dependence, cigarettes, uncomplicated: Secondary | ICD-10-CM | POA: Insufficient documentation

## 2018-11-15 DIAGNOSIS — F1020 Alcohol dependence, uncomplicated: Secondary | ICD-10-CM

## 2018-11-15 DIAGNOSIS — F1420 Cocaine dependence, uncomplicated: Secondary | ICD-10-CM

## 2018-11-15 DIAGNOSIS — T1491XA Suicide attempt, initial encounter: Secondary | ICD-10-CM | POA: Diagnosis not present

## 2018-11-15 DIAGNOSIS — R4589 Other symptoms and signs involving emotional state: Secondary | ICD-10-CM

## 2018-11-15 DIAGNOSIS — R4689 Other symptoms and signs involving appearance and behavior: Secondary | ICD-10-CM

## 2018-11-15 LAB — URINE DRUG SCREEN, QUALITATIVE (ARMC ONLY)
Amphetamines, Ur Screen: NOT DETECTED
Barbiturates, Ur Screen: NOT DETECTED
Benzodiazepine, Ur Scrn: NOT DETECTED
Cannabinoid 50 Ng, Ur ~~LOC~~: NOT DETECTED
Cocaine Metabolite,Ur ~~LOC~~: POSITIVE — AB
MDMA (Ecstasy)Ur Screen: NOT DETECTED
Methadone Scn, Ur: NOT DETECTED
Opiate, Ur Screen: NOT DETECTED
PHENCYCLIDINE (PCP) UR S: NOT DETECTED
Tricyclic, Ur Screen: NOT DETECTED

## 2018-11-15 LAB — CBC WITH DIFFERENTIAL/PLATELET
Abs Immature Granulocytes: 0.01 10*3/uL (ref 0.00–0.07)
BASOS PCT: 0 %
Basophils Absolute: 0 10*3/uL (ref 0.0–0.1)
EOS ABS: 0 10*3/uL (ref 0.0–0.5)
Eosinophils Relative: 0 %
HCT: 36.7 % — ABNORMAL LOW (ref 39.0–52.0)
Hemoglobin: 12.3 g/dL — ABNORMAL LOW (ref 13.0–17.0)
Immature Granulocytes: 0 %
Lymphocytes Relative: 23 %
Lymphs Abs: 1.6 10*3/uL (ref 0.7–4.0)
MCH: 26.8 pg (ref 26.0–34.0)
MCHC: 33.5 g/dL (ref 30.0–36.0)
MCV: 80 fL (ref 80.0–100.0)
Monocytes Absolute: 0.5 10*3/uL (ref 0.1–1.0)
Monocytes Relative: 8 %
Neutro Abs: 4.8 10*3/uL (ref 1.7–7.7)
Neutrophils Relative %: 69 %
Platelets: 291 10*3/uL (ref 150–400)
RBC: 4.59 MIL/uL (ref 4.22–5.81)
RDW: 13.5 % (ref 11.5–15.5)
WBC: 7 10*3/uL (ref 4.0–10.5)
nRBC: 0 % (ref 0.0–0.2)

## 2018-11-15 LAB — SALICYLATE LEVEL: Salicylate Lvl: <7 mg/dL (ref 2.8–30.0)

## 2018-11-15 LAB — COMPREHENSIVE METABOLIC PANEL
ALT: 17 U/L (ref 0–44)
AST: 16 U/L (ref 15–41)
Albumin: 5 g/dL (ref 3.5–5.0)
Alkaline Phosphatase: 72 U/L (ref 38–126)
Anion gap: 10 (ref 5–15)
BUN: 12 mg/dL (ref 6–20)
CO2: 23 mmol/L (ref 22–32)
Calcium: 8.9 mg/dL (ref 8.9–10.3)
Chloride: 109 mmol/L (ref 98–111)
Creatinine, Ser: 0.65 mg/dL (ref 0.61–1.24)
GFR calc Af Amer: >60 mL/min (ref >60)
GFR calc non Af Amer: >60 mL/min (ref >60)
Glucose, Bld: 109 mg/dL — ABNORMAL HIGH (ref 70–99)
Potassium: 3.4 mmol/L — ABNORMAL LOW (ref 3.5–5.1)
Sodium: 142 mmol/L (ref 135–145)
Total Bilirubin: 0.4 mg/dL (ref 0.3–1.2)
Total Protein: 8.4 g/dL — ABNORMAL HIGH (ref 6.5–8.1)

## 2018-11-15 LAB — CK: Total CK: 179 U/L (ref 49–397)

## 2018-11-15 LAB — ETHANOL: ALCOHOL ETHYL (B): 220 mg/dL — AB (ref <10)

## 2018-11-15 LAB — ACETAMINOPHEN LEVEL: Acetaminophen (Tylenol), Serum: <10 ug/mL — ABNORMAL LOW (ref 10–30)

## 2018-11-15 MED ORDER — TRAZODONE HCL 50 MG PO TABS
50.0000 mg | ORAL_TABLET | Freq: Every day | ORAL | Status: DC
  Administered 2018-11-15 – 2018-11-16 (×2): 50 mg via ORAL
  Filled 2018-11-15 (×2): qty 1

## 2018-11-15 MED ORDER — LORAZEPAM 1 MG PO TABS: 1.0000 mg | ORAL_TABLET | ORAL | Status: DC | PRN

## 2018-11-15 MED ORDER — LORAZEPAM 1 MG PO TABS
1.0000 mg | ORAL_TABLET | Freq: Two times a day (BID) | ORAL | Status: DC
  Administered 2018-11-15 – 2018-11-17 (×4): 1 mg via ORAL
  Filled 2018-11-15 (×4): qty 1

## 2018-11-15 NOTE — ED Notes (Signed)
Gave pt food tray and sprite.

## 2018-11-15 NOTE — ED Notes (Signed)
Pt slurring speech, asked this RN "are you my n**ga?", pt retracted when asked to clarify, pt obstinate, wants to use phone - directed to phone hours, resistant to changing clothes, eventually agreed when leveraged with cooperation equating to more sandwich trays, meal with sprite given

## 2018-11-15 NOTE — ED Notes (Signed)
Patient denies SI/HI/AVH.  Patient wants to discharge, however he states, "I want to wait until I wake up.  I want to sleep right now."

## 2018-11-15 NOTE — BH Assessment (Signed)
Referrals sent to the following:   [facility] (p) [phone]  (f) [phone]  [facility] (p) [phone] (f) [phone] (f) [phone] Call (p) [phone]  [facility] (p) [phone]

## 2018-11-15 NOTE — ED Notes (Signed)
Pt NPO.

## 2018-11-15 NOTE — ED Provider Notes (Signed)
[facility] Emergency Department Provider Note  ____________________________________________   First MD Initiated Contact with Patient 11/15/18 0451     (approximate)  I have reviewed the triage vital signs and the nursing notes.   HISTORY  Chief Complaint Psychiatric Evaluation   HPI Javier Glover is a 33 y.o. male who comes to the emergency department with [facility] under involuntary commitment for suicidal ideation.  The patient himself called 911 as he has been having increasing thoughts of self-harm.  He has no clear plan.  He denies actually trying to kill himself today.  His symptoms came on gradually have been slowly progressive are now severe.  They are worsened by interpersonal conflict and is somewhat improved by stable living situation.  He does report using cocaine this evening.    Past Medical History:  Diagnosis Date  . Seizures (HCC)     There are no active problems to display for this patient.   Past Surgical History:  Procedure Laterality Date  . FRACTURE SURGERY      Prior to Admission medications   Medication Sig Start Date End Date Taking? Authorizing Provider  amoxicillin (AMOXIL) 500 MG capsule Take 1 capsule (500 mg total) by mouth 3 (three) times daily. Patient not taking: Reported on 11/07/2014 10/09/14   Janne Napoleon, NP  HYDROcodone-acetaminophen (NORCO) 5-325 MG tablet Take 1 tablet by mouth every 6 (six) hours as needed for up to 7 doses for severe pain. 09/27/18   Merrily Brittle, MD  naproxen (NAPROSYN) 500 MG tablet Take 1 tablet (500 mg total) by mouth 2 (two) times daily. Patient not taking: Reported on 10/09/2014 10/04/14   Burgess Amor, PA-C  traMADol (ULTRAM) 50 MG tablet Take 1 tablet (50 mg total) by mouth every 6 (six) hours as needed. Patient not taking: Reported on 11/07/2014 10/09/14   Janne Napoleon, NP    Allergies Ibuprofen  No family history on file.  Social History Social History    Tobacco Use  . Smoking status: Current Every Day Smoker    Packs/day: 0.50    Years: 14.00    Pack years: 7.00    Types: Cigarettes  . Smokeless tobacco: Never Used  Substance Use Topics  . Alcohol use: Yes    Comment: occ  . Drug use: Yes    Review of Systems Constitutional: No fever/chills Eyes: No visual changes. ENT: No sore throat. Cardiovascular: Denies chest pain. Respiratory: Denies shortness of breath. Gastrointestinal: No abdominal pain.  Positive for nausea, no vomiting.  No diarrhea.  No constipation. Genitourinary: Negative for dysuria. Musculoskeletal: Negative for back pain. Skin: Negative for rash. Neurological: Negative for headaches, focal weakness or numbness.   ____________________________________________   PHYSICAL EXAM:  VITAL SIGNS: ED Triage Vitals  Enc Vitals Group     BP 11/15/18 0439 (!) 81/22     Pulse Rate 11/15/18 0439 (!) 108     Resp 11/15/18 0439 (!) 24     Temp 11/15/18 0439 97.6 F (36.4 C)     Temp Source 11/15/18 0439 Oral     SpO2 11/15/18 0439 98 %     Weight 11/15/18 0440 170 lb (77.1 kg)     Height 11/15/18 0440 5\' 7"  (1.702 m)     Head Circumference --      Peak Flow --      Pain Score --      Pain Loc --      Pain Edu? --  Excl. in GC? --     Constitutional: Alert and oriented x4 somewhat hyperverbal Eyes: PERRL EOMI. dilated and brisk Head: Atraumatic. Nose: No congestion/rhinnorhea. Mouth/Throat: No trismus Neck: No stridor.   Cardiovascular: Tachycardic rate, regular rhythm. Grossly normal heart sounds.  Good peripheral circulation. Respiratory: Increased respiratory effort.  No retractions. Lungs CTAB and moving good air Gastrointestinal: Soft nontender Musculoskeletal: No lower extremity edema   Neurologic:  Normal speech and language. No gross focal neurologic deficits are appreciated. Skin:  Skin is warm, dry and intact. No rash noted. Psychiatric: Anxious with depressed  affect    ____________________________________________   DIFFERENTIAL includes but not limited to  Suicidal ideation, suicide attempt, drug overdose, malingering ____________________________________________   LABS (all labs ordered are listed, but only abnormal results are displayed)  Labs Reviewed  ACETAMINOPHEN LEVEL - Abnormal; Notable for the following components:      Result Value   Acetaminophen (Tylenol), Serum <10 (*)    All other components within normal limits  COMPREHENSIVE METABOLIC PANEL - Abnormal; Notable for the following components:   Potassium 3.4 (*)    Glucose, Bld 109 (*)    Total Protein 8.4 (*)    All other components within normal limits  ETHANOL - Abnormal; Notable for the following components:   Alcohol, Ethyl (B) 220 (*)    All other components within normal limits  CBC WITH DIFFERENTIAL/PLATELET - Abnormal; Notable for the following components:   Hemoglobin 12.3 (*)    HCT 36.7 (*)    All other components within normal limits  URINE DRUG SCREEN, QUALITATIVE ([facility] ONLY) - Abnormal; Notable for the following components:   Cocaine Metabolite,Ur [place] POSITIVE (*)    All other components within normal limits  SALICYLATE LEVEL  CK    Lab work reviewed by me shows the patient is cocaine and alcohol positive __________________________________________  EKG   ____________________________________________  RADIOLOGY   ____________________________________________   PROCEDURES  Procedure(s) performed: no  Procedures  Critical Care performed: no  ____________________________________________   INITIAL IMPRESSION / ASSESSMENT AND PLAN / ED COURSE  Pertinent labs & imaging results that were available during my care of the patient were reviewed by me and considered in my medical decision making (see chart for details).   As part of my medical decision making, I reviewed the following data within the electronic MEDICAL RECORD NUMBER History obtained  from family if available, nursing notes, old chart and ekg, as well as notes from prior ED visits.  By the time I saw the patient he was more calm and cooperative although apparently in triage she was hyperverbal and according to staff was "acting out".  I was able to verbally redirect the patient.  I am upholding his involuntary commitment and psych theatric consultation is pending.  ----------------------------------------- 8:45 AM on 11/15/2018 -----------------------------------------  The patient's lab work is positive for cocaine and alcohol although otherwise unremarkable and at this point he is medically stable for psychiatric evaluation.  Apparently the specialist on-call attempted to speak with the patient however he refused earlier requesting that all the lights be turned off and additional snacks.  I have turned the lights on in the room and made the patient n.p.o. until he agrees to speak with a psychiatrist.      ____________________________________________   FINAL CLINICAL IMPRESSION(S) / ED DIAGNOSES  Final diagnoses:  Suicidal ideation  Cocaine intoxication with perceptual disturbance (HCC)  Alcohol intoxication with delirium (HCC)      NEW MEDICATIONS STARTED DURING THIS  VISIT:  New Prescriptions   No medications on file     Note:  This document was prepared using Dragon voice recognition software and may include unintentional dictation errors.    Merrily Brittleifenbark, Daved Mcfann, MD 11/15/18 [phone]

## 2018-11-15 NOTE — ED Notes (Signed)
Report to include Situation, Background, Assessment, and Recommendations [facility] from [facility]. Patient alert and oriented, warm and dry, in no acute distress. Patient reported SI and denied HI, AVH and pain. Patient made aware of Q15 minute rounds and Psychologist, counsellingover and Officer presence for their safety. Patient instructed to come to me with needs or concerns.

## 2018-11-15 NOTE — BH Assessment (Signed)
This writer attempted to complete TTS consult, patient unable to be aroused to participate.

## 2018-11-15 NOTE — ED Notes (Signed)
Hourly rounding reveals patient in room. No complaints, stable, in no acute distress. Q15 minute rounds and monitoring via Rover and Officer to continue.   

## 2018-11-15 NOTE — ED Notes (Signed)
Pt given sprite to drink. 

## 2018-11-15 NOTE — ED Triage Notes (Signed)
[facility]  Office Iona HansenCarey states he responded to a 911 call where caller stated he wanted to kill himself.  On his arrival patient stated in his presence that he wanted to kill him self.  Patient also stated in the presence of the RN that he has SI.

## 2018-11-16 DIAGNOSIS — R4589 Other symptoms and signs involving emotional state: Secondary | ICD-10-CM

## 2018-11-16 DIAGNOSIS — T1491XA Suicide attempt, initial encounter: Secondary | ICD-10-CM

## 2018-11-16 DIAGNOSIS — F1020 Alcohol dependence, uncomplicated: Secondary | ICD-10-CM

## 2018-11-16 DIAGNOSIS — R4689 Other symptoms and signs involving appearance and behavior: Secondary | ICD-10-CM

## 2018-11-16 DIAGNOSIS — F1410 Cocaine abuse, uncomplicated: Secondary | ICD-10-CM

## 2018-11-16 DIAGNOSIS — F1420 Cocaine dependence, uncomplicated: Secondary | ICD-10-CM

## 2018-11-16 NOTE — ED Notes (Signed)
Gave food tray with juice. 

## 2018-11-16 NOTE — ED Notes (Signed)
Gave pt some vanilla ice cream and cherry ice per request.

## 2018-11-16 NOTE — ED Provider Notes (Signed)
-----------------------------------------   6:25 AM on 11/16/2018 -----------------------------------------   Blood pressure (!) 91/59, pulse 84, temperature 98 F (36.7 C), temperature source Oral, resp. rate 19, height 5\' 7"  (1.702 m), weight 77.1 kg, SpO2 98 %.  The patient is calm and cooperative at this time.  There have been no acute events since the last update.  Awaiting disposition plan from Behavioral Medicine team.    Irean Hong, MD 11/16/18 [phone]

## 2018-11-16 NOTE — ED Notes (Signed)
[facility] PENDING  GOING  TO  BEH MED ON 11/17/18

## 2018-11-16 NOTE — ED Notes (Signed)
Pt. Transferred to [facility] from ED to room 2 after screening for contraband. Report to include Situation, Background, Assessment and Recommendations from Ann RN. Pt. Oriented to unit including Q15 minute rounds as well as the security cameras for their protection. Patient is alert and oriented, warm and dry in no acute distress. Patient denies SI, HI, and AVH. Pt. Encouraged to let me know if needs arise. 

## 2018-11-16 NOTE — ED Notes (Signed)
Hourly rounding reveals patient sleeping in room. No complaints, stable, in no acute distress. Q15 minute rounds and monitoring via Security Cameras to continue. 

## 2018-11-16 NOTE — Progress Notes (Signed)
[facility] MD Progress Note  11/16/2018 1:08 PM Javier Glover  MRN:  [id number] Subjective: "I drink a lot every day." Principal Problem: Suicidal behavior Diagnosis: Principal Problem:   Suicidal behavior Active Problems:   Cocaine abuse (HCC)   Alcohol use disorder, severe, dependence (HCC)  Total Time spent with patient: 35 minutes  Past Psychiatric History: Javier Glover is a 33 y.o. male with a history of polysubstance abuse who presented to the emergency department by the sheriff department due to repeated suicidal comments.  Patient called 911 himself reporting he would kill himself.  Patient again continued to endorse suicidality after arrival to the emergency department to nursing, physician and psychiatry staff.  On evaluation today, patient reports that he "drinks a lot of alcohol every day."  He states he has been more depressed because "I have a lot on my mind, my mom is sick, and I was wanting to kill myself."  Patient reports that he was intending to cut his wrist.  He shows a large scar on his left forearm where he cut his wrist 1 year ago and was admitted to psychiatric hospitalization at [facility] [place].  Patient reports that he did not follow-up with outpatient psychiatry treatment.  Patient refuses to quantify amount that he drinks daily, but does endorse that he drinks both beer and liquor every day.  He states he has not been successful with detox programs in the past.  Patient reports that he has been living with his brother, Javier Glover.  Patient minimizes symptoms at presentation, and inappropriately laughs and smiles throughout the interview.  He refuses to allow collateral to be obtained from his brother or his mother.  Patient states that he does use cocaine, but endorses last use was 1 week ago (UDS today is positive for cocaine). Patient is anxious to attempt to be discharged.  His affect is incongruent with presentation.  He denies suicidal and homicidal  ideation.  He denies auditory and visual hallucinations.  Patient reports that he does not have access to a gun.   Past Medical History:  Past Medical History:  Diagnosis Date  . Seizures (HCC)     Past Surgical History:  Procedure Laterality Date  . FRACTURE SURGERY     Family History: No family history on file. Family Psychiatric  History: Denies Social History:  Social History   Substance and Sexual Activity  Alcohol Use Yes   Comment: occ     Social History   Substance and Sexual Activity  Drug Use Yes    Social History   Socioeconomic History  . Marital status: Single    Spouse name: Not on file  . Number of children: Not on file  . Years of education: Not on file  . Highest education level: Not on file  Occupational History  . Not on file  Social Needs  . Financial resource strain: Not on file  . Food insecurity:    Worry: Not on file    Inability: Not on file  . Transportation needs:    Medical: Not on file    Non-medical: Not on file  Tobacco Use  . Smoking status: Current Every Day Smoker    Packs/day: 0.50    Years: 14.00    Pack years: 7.00    Types: Cigarettes  . Smokeless tobacco: Never Used  Substance and Sexual Activity  . Alcohol use: Yes    Comment: occ  . Drug use: Yes  . Sexual activity:  Not on file  Lifestyle  . Physical activity:    Days per week: Not on file    Minutes per session: Not on file  . Stress: Not on file  Relationships  . Social connections:    Talks on phone: Not on file    Gets together: Not on file    Attends religious service: Not on file    Active member of club or organization: Not on file    Attends meetings of clubs or organizations: Not on file    Relationship status: Not on file  Other Topics Concern  . Not on file  Social History Narrative  . 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                Sleep: Fair  Appetite:  Fair  Current  Medications: Current Facility-Administered Medications  Medication Dose Route Frequency Provider Last Rate Last Dose  . LORazepam (ATIVAN) tablet 1 mg  1 mg Oral BID Dionne BucySiadecki, Sebastian, MD   1 mg at 11/16/18 0934  . LORazepam (ATIVAN) tablet 1 mg  1 mg Oral Q4H PRN Dionne BucySiadecki, Sebastian, MD      . traZODone (DESYREL) tablet 50 mg  50 mg Oral QHS Dionne BucySiadecki, Sebastian, MD   50 mg at 11/15/18 2153   No current outpatient medications on file.    Lab Results:  Results for orders placed or performed during the hospital encounter of 11/15/18 (from the past 48 hour(s))  Acetaminophen level     Status: Abnormal   Collection Time: 11/15/18  5:02 AM  Result Value Ref Range   Acetaminophen (Tylenol), Serum <10 (L) 10 - 30 ug/mL    Comment: (NOTE) Therapeutic concentrations vary significantly. A range of 10-30 ug/mL  may be an effective concentration for many patients. However, some  are best treated at concentrations outside of this range. Acetaminophen concentrations >150 ug/mL at 4 hours after ingestion  and >50 ug/mL at 12 hours after ingestion are often associated with  toxic reactions. Performed at [facility], [address] [place], [place] [address]   Comprehensive metabolic panel     Status: Abnormal   Collection Time: 11/15/18  5:02 AM  Result Value Ref Range   Sodium 142 135 - 145 mmol/L   Potassium 3.4 (L) 3.5 - 5.1 mmol/L   Chloride 109 98 - 111 mmol/L   CO2 23 22 - 32 mmol/L   Glucose, Bld 109 (H) 70 - 99 mg/dL   BUN 12 6 - 20 mg/dL   Creatinine, Ser 9.140.65 0.61 - 1.24 mg/dL   Calcium 8.9 8.9 - 78.210.3 mg/dL   Total Protein 8.4 (H) 6.5 - 8.1 g/dL   Albumin 5.0 3.5 - 5.0 g/dL   AST 16 15 - 41 U/L   ALT 17 0 - 44 U/L   Alkaline Phosphatase 72 38 - 126 U/L   Total Bilirubin 0.4 0.3 - 1.2 mg/dL   GFR calc non Af Amer >60 >60 mL/min   GFR calc Af Amer >60 >60 mL/min   Anion gap 10 5 - 15    Comment: Performed at [facility], [address] [place],  [place] [address]  Ethanol     Status: Abnormal   Collection Time: 11/15/18  5:02 AM  Result Value Ref Range   Alcohol, Ethyl (B) 220 (H) <10 mg/dL    Comment: (NOTE) Lowest detectable limit for serum  alcohol is 10 mg/dL. For medical purposes only. Performed at [facility], [address] [place], [place] [address]   Salicylate level     Status: None   Collection Time: 11/15/18  5:02 AM  Result Value Ref Range   Salicylate Lvl <7.0 2.8 - 30.0 mg/dL    Comment: Performed at [facility], [address] [place], [place] [address]  CBC with Differential     Status: Abnormal   Collection Time: 11/15/18  5:02 AM  Result Value Ref Range   WBC 7.0 4.0 - 10.5 K/uL   RBC 4.59 4.22 - 5.81 MIL/uL   Hemoglobin 12.3 (L) 13.0 - 17.0 g/dL   HCT 09.836.7 (L) 11.939.0 - 14.752.0 %   MCV 80.0 80.0 - 100.0 fL   MCH 26.8 26.0 - 34.0 pg   MCHC 33.5 30.0 - 36.0 g/dL   RDW 82.913.5 56.211.5 - 13.015.5 %   Platelets 291 150 - 400 K/uL   nRBC 0.0 0.0 - 0.2 %   Neutrophils Relative % 69 %   Neutro Abs 4.8 1.7 - 7.7 K/uL   Lymphocytes Relative 23 %   Lymphs Abs 1.6 0.7 - 4.0 K/uL   Monocytes Relative 8 %   Monocytes Absolute 0.5 0.1 - 1.0 K/uL   Eosinophils Relative 0 %   Eosinophils Absolute 0.0 0.0 - 0.5 K/uL   Basophils Relative 0 %   Basophils Absolute 0.0 0.0 - 0.1 K/uL   Immature Granulocytes 0 %   Abs Immature Granulocytes 0.01 0.00 - 0.07 K/uL    Comment: Performed at [facility], [address] [place], [place] [address]  Urine Drug Screen, Qualitative     Status: Abnormal   Collection Time: 11/15/18  5:02 AM  Result Value Ref Range   Tricyclic, Ur Screen NONE DETECTED NONE DETECTED   Amphetamines, Ur Screen NONE DETECTED NONE DETECTED   MDMA (Ecstasy)Ur Screen NONE DETECTED NONE DETECTED   Cocaine Metabolite,Ur [place] POSITIVE (A) NONE DETECTED   Opiate, Ur Screen NONE DETECTED NONE DETECTED   Phencyclidine (PCP) Ur S NONE DETECTED NONE DETECTED   Cannabinoid 50 Ng, Ur [place] NONE  DETECTED NONE DETECTED   Barbiturates, Ur Screen NONE DETECTED NONE DETECTED   Benzodiazepine, Ur Scrn NONE DETECTED NONE DETECTED   Methadone Scn, Ur NONE DETECTED NONE DETECTED    Comment: (NOTE) Tricyclics + metabolites, urine    Cutoff 1000 ng/mL Amphetamines + metabolites, urine  Cutoff 1000 ng/mL MDMA (Ecstasy), urine              Cutoff 500 ng/mL Cocaine Metabolite, urine          Cutoff 300 ng/mL Opiate + metabolites, urine        Cutoff 300 ng/mL Phencyclidine (PCP), urine         Cutoff 25 ng/mL Cannabinoid, urine                 Cutoff 50 ng/mL Barbiturates + metabolites, urine  Cutoff 200 ng/mL Benzodiazepine, urine              Cutoff 200 ng/mL Methadone, urine                   Cutoff 300 ng/mL The urine drug screen provides only a preliminary, unconfirmed analytical test result and should not be used for non-medical purposes. Clinical consideration and professional judgment should be applied to any positive drug screen result due to possible interfering substances. A more specific alternate chemical method must  be used in order to obtain a confirmed analytical result. Gas chromatography / mass spectrometry (GC/MS) is the preferred confirmat ory method. Performed at [facility], [address] [place], [place] [address]   CK     Status: None   Collection Time: 11/15/18  5:02 AM  Result Value Ref Range   Total CK 179 49 - 397 U/L    Comment: Performed at [facility], [address] [place], [place] [address]    Blood Alcohol level:  Lab Results  Component Value Date   ETH 220 (H) 11/15/2018   ETH 112 (H) 05/17/2018    Metabolic Disorder Labs: No results found for: HGBA1C, MPG No results found for: PROLACTIN No results found for: CHOL, TRIG, HDL, CHOLHDL, VLDL, LDLCALC  Physical Findings: AIMS:  , ,  ,  ,    CIWA:    COWS:     Musculoskeletal: Strength & Muscle Tone: within normal limits Gait & Station: normal Patient leans:  N/A  Psychiatric Specialty Exam: Physical Exam  Nursing note and vitals reviewed. Constitutional: He is oriented to person, place, and time. He appears well-developed and well-nourished. No distress.  HENT:  Head: Normocephalic and atraumatic.  Eyes: EOM are normal.  Neck: Normal range of motion.  Cardiovascular: Normal rate.  Respiratory: Effort normal.  Musculoskeletal: Normal range of motion.  Neurological: He is alert and oriented to person, place, and time.    Review of Systems  Constitutional: Negative.   HENT: Negative.   Respiratory: Negative.   Cardiovascular: Negative.   Gastrointestinal: Negative.   Musculoskeletal: Negative.   Psychiatric/Behavioral: Positive for depression, substance abuse and suicidal ideas. Negative for hallucinations and memory loss. The patient is nervous/anxious. The patient does not have insomnia.     Blood pressure 101/65, pulse 85, temperature 98 F (36.7 C), temperature source Oral, resp. rate 18, height 5\' 7"  (1.702 m), weight 77.1 kg, SpO2 98 %.Body mass index is 26.63 kg/m.  General Appearance: Casual and Neat  Eye Contact:  Fair  Speech:  Clear and Coherent and Pressured  Volume:  Normal  Mood:  Anxious and Irritable  Affect:  Appropriate  Thought Process:  Coherent, Goal Directed and Descriptions of Associations: Tangential  Orientation:  Full (Time, Place, and Person)  Thought Content:  Logical, Rumination and Tangential  Suicidal Thoughts:  Yes.  with intent/plan  Homicidal Thoughts:  No  Memory:  fair  Judgement:  Poor  Insight:  Shallow  Psychomotor Activity:  Restlessness  Concentration:  Concentration: Fair  Recall:  Fair  Fund of Knowledge:  Fair  Language:  Good  Akathisia:  No  Handed:  Right  AIMS (if indicated):     Assets:  Communication Skills Social Support  ADL's:  Intact  Cognition:  WNL  Sleep:   fair     Treatment Plan Summary: Daily contact with patient to assess and evaluate symptoms and progress  in treatment and Plan Admit to inpatient psychiatry.  Continue involuntary commitment.  Medication management deferred to primary treatment team.  Patient does not desire medications at this time.  Mariel Craft, MD 11/16/2018, 1:08 PM

## 2018-11-16 NOTE — BH Assessment (Signed)
Patient's information has been faxed to the following for inpatient placement:  [facility]    [facility]   [facility]   [facility]   [facility]   [facility]   [facility]

## 2018-11-16 NOTE — ED Notes (Signed)
Breakfast tray placed in pt room. Pt sleeping 

## 2018-11-16 NOTE — BH Assessment (Signed)
Patient is to be admitted to [facility] by Dr. Viviano Simas.  Attending Physician will be Dr. Jennet Maduro.   Patient has been assigned to room 324, by [facility] Charge Nurse Gigi.   Intake Paper Work has been signed and placed on patient chart.  ER staff is aware of the admission:  Misty Stanley, ER Secretary    Dr. Don Perking, ER MD   Geralynn Ochs, Patient's Nurse   Sharmon Leyden, Patient Access.   *Due to staffing, pt is to be dmitted to [facility] in the AM (11/17/2018)

## 2018-11-16 NOTE — ED Notes (Signed)
Patient was infomred by psychiatrist that he will be admitted inpatient, patient wants to be discharged back to home, he feels he does not need

## 2018-11-16 NOTE — ED Notes (Signed)
Patient is on the phone 

## 2018-11-17 ENCOUNTER — Other Ambulatory Visit: Payer: Self-pay

## 2018-11-17 ENCOUNTER — Inpatient Hospital Stay
Admission: AD | Admit: 2018-11-17 | Discharge: 2018-11-18 | DRG: 885 | Disposition: A | Discharge status: Home / Self Care | Payer: Medicaid Other | Attending: Psychiatry | Admitting: Psychiatry

## 2018-11-17 DIAGNOSIS — Z765 Malingerer [conscious simulation]: Secondary | ICD-10-CM

## 2018-11-17 DIAGNOSIS — F1420 Cocaine dependence, uncomplicated: Secondary | ICD-10-CM | POA: Diagnosis present

## 2018-11-17 DIAGNOSIS — R569 Unspecified convulsions: Secondary | ICD-10-CM | POA: Diagnosis present

## 2018-11-17 DIAGNOSIS — Y903 Blood alcohol level of 60-79 mg/100 ml: Secondary | ICD-10-CM | POA: Diagnosis present

## 2018-11-17 DIAGNOSIS — F79 Unspecified intellectual disabilities: Secondary | ICD-10-CM | POA: Diagnosis present

## 2018-11-17 DIAGNOSIS — F329 Major depressive disorder, single episode, unspecified: Secondary | ICD-10-CM | POA: Diagnosis not present

## 2018-11-17 DIAGNOSIS — F1020 Alcohol dependence, uncomplicated: Secondary | ICD-10-CM | POA: Diagnosis present

## 2018-11-17 DIAGNOSIS — Z72811 Adult antisocial behavior: Secondary | ICD-10-CM

## 2018-11-17 DIAGNOSIS — F1520 Other stimulant dependence, uncomplicated: Secondary | ICD-10-CM | POA: Diagnosis present

## 2018-11-17 DIAGNOSIS — F17200 Nicotine dependence, unspecified, uncomplicated: Secondary | ICD-10-CM | POA: Diagnosis present

## 2018-11-17 DIAGNOSIS — R4589 Other symptoms and signs involving emotional state: Secondary | ICD-10-CM | POA: Diagnosis present

## 2018-11-17 DIAGNOSIS — R45851 Suicidal ideations: Secondary | ICD-10-CM | POA: Diagnosis present

## 2018-11-17 DIAGNOSIS — F17210 Nicotine dependence, cigarettes, uncomplicated: Secondary | ICD-10-CM | POA: Diagnosis present

## 2018-11-17 DIAGNOSIS — F1220 Cannabis dependence, uncomplicated: Secondary | ICD-10-CM | POA: Diagnosis present

## 2018-11-17 DIAGNOSIS — F332 Major depressive disorder, recurrent severe without psychotic features: Principal | ICD-10-CM | POA: Diagnosis present

## 2018-11-17 DIAGNOSIS — R4689 Other symptoms and signs involving appearance and behavior: Secondary | ICD-10-CM

## 2018-11-17 DIAGNOSIS — Z886 Allergy status to analgesic agent status: Secondary | ICD-10-CM

## 2018-11-17 MED ORDER — MAGNESIUM HYDROXIDE 400 MG/5ML PO SUSP: 30.0000 mL | Freq: Every day | ORAL | Status: DC | PRN

## 2018-11-17 MED ORDER — TRAZODONE HCL 50 MG PO TABS
50.0000 mg | ORAL_TABLET | Freq: Every day | ORAL | Status: DC
  Administered 2018-11-17: 50 mg via ORAL
  Filled 2018-11-17: qty 1

## 2018-11-17 MED ORDER — HYDROXYZINE HCL 50 MG PO TABS: 50.0000 mg | ORAL_TABLET | Freq: Three times a day (TID) | ORAL | Status: DC | PRN

## 2018-11-17 MED ORDER — ACETAMINOPHEN 325 MG PO TABS: 650.0000 mg | ORAL_TABLET | Freq: Four times a day (QID) | ORAL | Status: DC | PRN

## 2018-11-17 MED ORDER — LORAZEPAM 1 MG PO TABS: 1.0000 mg | ORAL_TABLET | ORAL | Status: DC | PRN

## 2018-11-17 MED ORDER — ALUM & MAG HYDROXIDE-SIMETH 200-200-20 MG/5ML PO SUSP: 30.0000 mL | ORAL | Status: DC | PRN

## 2018-11-17 NOTE — Plan of Care (Signed)
  Problem: Education: Goal: Knowledge of [facility] Education information/materials will improve Note:  Patient  instructed  on unit programing and hand washing . Able to verbalize understanding . Staff continue to inform and redirect.

## 2018-11-17 NOTE — BHH Suicide Risk Assessment (Signed)
[facility] Admission Suicide Risk Assessment   Nursing information obtained from:  Patient Demographic factors:  Male, Unemployed Current Mental Status:  NA Loss Factors:  Financial problems / change in socioeconomic status Historical Factors:  NA Risk Reduction Factors:  Positive social support  Total Time spent with patient: 1 hour Principal Problem: MDD (major depressive disorder), recurrent severe, without psychosis (HCC) Diagnosis:  Principal Problem:   MDD (major depressive disorder), recurrent severe, without psychosis (HCC) Active Problems:   Suicidal behavior   Cocaine use disorder, moderate, dependence (HCC)   Alcohol use disorder, severe, dependence (HCC)   Cannabis use disorder, moderate, dependence (HCC)   Amphetamine use disorder, severe, dependence (HCC)   Tobacco use disorder  Subjective Data: suicidal ideation  Continued Clinical Symptoms:  Alcohol Use Disorder Identification Test Final Score (AUDIT): 19 The "Alcohol Use Disorders Identification Test", Guidelines for Use in Primary Care, Second Edition.  World Science writer [facility]). Score between 0-7:  no or low risk or alcohol related problems. Score between 8-15:  moderate risk of alcohol related problems. Score between 16-19:  high risk of alcohol related problems. Score 20 or above:  warrants further diagnostic evaluation for alcohol dependence and treatment.   CLINICAL FACTORS:   Depression:   Comorbid alcohol abuse/dependence Impulsivity Alcohol/Substance Abuse/Dependencies   Musculoskeletal: Strength & Muscle Tone: within normal limits Gait & Station: normal Patient leans: N/A  Psychiatric Specialty Exam: Physical Exam  Nursing note and vitals reviewed. Psychiatric: Javier Glover speech is normal. Javier Glover mood appears anxious. Javier Glover affect is inappropriate. Cognition and memory are normal. He expresses impulsivity. He expresses suicidal ideation. He expresses suicidal plans.    Review of Systems  Neurological:  Negative.   Psychiatric/Behavioral: Negative.   All other systems reviewed and are negative.   Blood pressure 125/70, pulse 72, temperature 98.2 F (36.8 C), temperature source Oral, resp. rate 18, height 5\' 11"  (1.803 m), weight 76.7 kg, SpO2 97 %.Body mass index is 23.57 kg/m.  General Appearance: Casual  Eye Contact:  Good  Speech:  Clear and Coherent  Volume:  Normal  Mood:  Dysphoric  Affect:  Inappropriate  Thought Process:  Irrelevant  Orientation:  Full (Time, Place, and Person)  Thought Content:  Illogical  Suicidal Thoughts:  Yes.  with intent/plan  Homicidal Thoughts:  No  Memory:  Immediate;   Poor Recent;   Poor Remote;   Poor  Judgement:  Poor  Insight:  Lacking  Psychomotor Activity:  Normal  Concentration:  Concentration: Poor and Attention Span: Poor  Recall:  Poor  Fund of Knowledge:  Poor  Language:  Poor  Akathisia:  No  Handed:  Right  AIMS (if indicated):     Assets:  Communication Skills Desire for Improvement Financial Resources/Insurance Physical Health Resilience  ADL's:  Intact  Cognition:  WNL  Sleep:         COGNITIVE FEATURES THAT CONTRIBUTE TO RISK:  None    SUICIDE RISK:   Moderate:  Frequent suicidal ideation with limited intensity, and duration, some specificity in terms of plans, no associated intent, good self-control, limited dysphoria/symptomatology, some risk factors present, and identifiable protective factors, including available and accessible social support.  PLAN OF CARE: hospital admission, medication management, substance abuse counseling, discharge planning.  Javier Glover is a 33 year old male with a history of depression and substance abuse.  #Suicidal ideation -the patient denies any now  #Substance abuse -positive for amphetamines, cocaine, cannabis, BAL 64 -minimizes problems declines treatment  #Disposition -discharge with family -follow up with  [facility]   I certify that inpatient services furnished can  reasonably be expected to improve the patient's condition.   Kristine Linea, MD 11/17/2018, 11:54 AM

## 2018-11-17 NOTE — ED Notes (Signed)
Hourly rounding reveals patient sleeping in room. No complaints, stable, in no acute distress. Q15 minute rounds and monitoring via Security Cameras to continue. 

## 2018-11-17 NOTE — Tx Team (Signed)
Initial Treatment Plan 11/17/2018 4:53 PM Javier Glover WHQ:[id number]    PATIENT STRESSORS: Financial difficulties Health problems Substance abuse   PATIENT STRENGTHS: Ability for insight Capable of independent living Supportive family/friends   PATIENT IDENTIFIED PROBLEMS: Suicidal Ideations 1/2//2020  Depression  11/17/2018                   DISCHARGE CRITERIA:  Ability to meet basic life and health needs Improved stabilization in mood, thinking, and/or behavior  PRELIMINARY DISCHARGE PLAN: Outpatient therapy Return to previous living arrangement  PATIENT/FAMILY INVOLVEMENT: This treatment plan has been presented to and reviewed with the patient, Javier Glover, and/or family member,   The patient and family have been given the opportunity to ask questions and make suggestions.  Crist Infante, RN 11/17/2018, 4:53 PM

## 2018-11-17 NOTE — BHH Group Notes (Signed)
LCSW Group Therapy Note  11/17/2018 1:00 PM  Type of Therapy/Topic:  Group Therapy:  Feelings about Diagnosis  Participation Level:  Active   Description of Group:   This group will allow patients to explore their thoughts and feelings about diagnoses they have received. Patients will be guided to explore their level of understanding and acceptance of these diagnoses. Facilitator will encourage patients to process their thoughts and feelings about the reactions of others to their diagnosis and will guide patients in identifying ways to discuss their diagnosis with significant others in their lives. This group will be process-oriented, with patients participating in exploration of their own experiences, giving and receiving support, and processing challenge from other group members.   Therapeutic Goals: 1. Patient will demonstrate understanding of diagnosis as evidenced by identifying two or more symptoms of the disorder 2. Patient will be able to express two feelings regarding the diagnosis 3. Patient will demonstrate their ability to communicate their needs through discussion and/or role play  Summary of Patient Progress: Patient was present for group.  Patient required a lot of redirection as he appeared to want to discuss the recent death of his "family" from an overdose.  Patient was easily redirectable, however, it was necessary to redirect often.  Patient did not provide any other personal information, however, was supportive of other group members and what they shared.    Therapeutic Modalities:   Cognitive Behavioral Therapy Brief Therapy Feelings Identification   Penni Homans, MSW, LCSW 11/17/2018 2:45 PM

## 2018-11-17 NOTE — BH Assessment (Signed)
TTS informed [facility] BM[facility] Charge RN and ED RN of patient's admission orders to [facility].  

## 2018-11-17 NOTE — ED Provider Notes (Signed)
-----------------------------------------   5:49 AM on 11/17/2018 -----------------------------------------   Blood pressure 120/73, pulse 78, temperature 97.7 F (36.5 C), temperature source Oral, resp. rate 18, height 5\' 7"  (1.702 m), weight 77.1 kg, SpO2 98 %.  The patient is calm and cooperative at this time.  There have been no acute events since the last update.  Awaiting disposition plan from Behavioral Medicine team.    Myrna BlazerSchaevitz, Jacorie Ernsberger Matthew, MD 11/17/18 [phone]

## 2018-11-17 NOTE — ED Notes (Signed)
Patient took his scheduled medications and his been appropriate and cooperative. NAD noted. Denies SI/HI/AVH.
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
## 2018-11-17 NOTE — Progress Notes (Signed)
Admission: Britt Bottom RN  From ER D: Pt appeared depressed  With  a flat affect.  Pt  denies SI / AVH at this time. Pt is redirectable and cooperative with assessment.  33 year old black male in under the care of  Dr. Jennet Maduro . Patient present to the ER with suicidal Ideation  With no plan . Patient reports using cocaine.  Affect cheerful  Smiling through out the process.  Noted to let writer know  he has several girlfriends.  Noted this is the focus . Adamant about not leaving  Until he has spent a couple days.    A: Pt admitted to unit per protocol, skin assessment and search done and no contraband found.  Pt  educated on therapeutic milieu rules. Pt was introduced to milieu by nursing staff.    R: Pt was receptive to education about the milieu .  15 min safety checks started. Clinical research associate offered support

## 2018-11-17 NOTE — [facility] Group Notes (Signed)
[facility] Group Notes:  (Nursing/MHT/Case Management/Adjunct)  Date:  11/17/2018  Time:  10:25 PM  Type of Therapy:  Group Therapy  Participation Level:  Minimal  Participation Quality:  Appropriate  Affect:  Appropriate  Cognitive:  Appropriate  Insight:  Limited  Engagement in Group:  Limited  Modes of Intervention:  Discussion  Summary of Progress/Problems: Konstandinos attended group. Brianne stated he did not have any goals on this day. Alain stayed for the entire group. MHT informed patients of rules and expectations while on the unit. MHT informed patients they needed to provide their code to any one they wanted to be able to contact them. MHT informed patients of visitation hours and phone hours. MHT informed patients of to fill out snack sheet. MHT informed patients food was not allowed in the rooms. MHT informed patients there was no touching, hugging, or doing other patient's hair on the unit. MHT informed patients of routine checks and to cover appropriately throughout the night. MHT reminded patients not to give out personal information and to only use first names. Jinger Neighbors 11/17/2018, 10:25 PM
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
## 2018-11-18 NOTE — Progress Notes (Signed)
Recreation Therapy Notes  Date: 11/18/2018  Time: 9:30 am   Location: Craft room   Behavioral response: N/A   Intervention Topic: Problem Solving  Discussion/Intervention: Patient did not attend group.   Clinical Observations/Feedback:  Patient did not attend group.   Mykaylah Ballman LRT/CTRS         Amairani Shuey 11/18/2018 11:51 AM
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
## 2018-11-18 NOTE — Progress Notes (Signed)
  [facility] Adult Case Management Discharge Plan :  Will you be returning to the same living situation after discharge:  Yes,  pt reports he plans to return to the godbrothers home.  At discharge, do you have transportation home?: Yes,  pt's sister will provide transportation.  Do you have the ability to pay for your medications: Yes,  Medicaid  Release of information consent forms completed and in the chart;  Patient's signature needed at discharge.  Patient to Follow up at: Follow-up Information    [facility] Follow up on 11/23/2018.   Why:  Once discharged please call Lorella Nimrod.  He is scheduled topick you up at 7:15AM for Peer Support Services.  Contact information: [address] [place] [address] [phone]           Next level of care provider has access to [facility] Link:no  Safety Planning and Suicide Prevention discussed: Yes,  SPE completed with sister.   Have you used any form of tobacco in the last 30 days? (Cigarettes, Smokeless Tobacco, Cigars, and/or Pipes): Yes  Has patient been referred to the Quitline?: Patient refused referral  Patient has been referred for addiction treatment: Yes  Harden Mo, LCSW 11/18/2018, 1:16 PM

## 2018-11-18 NOTE — BHH Suicide Risk Assessment (Signed)
[facility] Discharge Suicide Risk Assessment   Principal Problem: MDD (major depressive disorder), recurrent severe, without psychosis (HCC) Discharge Diagnoses: Principal Problem:   MDD (major depressive disorder), recurrent severe, without psychosis (HCC) Active Problems:   Suicidal behavior   Cocaine use disorder, moderate, dependence (HCC)   Alcohol use disorder, severe, dependence (HCC)   Cannabis use disorder, moderate, dependence (HCC)   Amphetamine use disorder, severe, dependence (HCC)   Tobacco use disorder   Intellectual disability   Total Time spent with patient: 20 minutes  Musculoskeletal: Strength & Muscle Tone: within normal limits Gait & Station: normal Patient leans: N/A  Psychiatric Specialty Exam: Review of Systems  Neurological: Negative.   Psychiatric/Behavioral: Positive for substance abuse.  All other systems reviewed and are negative.   Blood pressure 116/75, pulse 76, temperature 97.8 F (36.6 C), temperature source Oral, resp. rate 18, height 5\' 11"  (1.803 m), weight 76.7 kg, SpO2 100 %.Body mass index is 23.57 kg/m.  General Appearance: Casual  Eye Contact::  Good  Speech:  Clear and Coherent409  Volume:  Normal  Mood:  Euthymic  Affect:  Inappropriate  Thought Process:  Goal Directed and Descriptions of Associations: Tangential  Orientation:  Full (Time, Place, and Person)  Thought Content:  WDL  Suicidal Thoughts:  No  Homicidal Thoughts:  No  Memory:  Immediate;   Poor Recent;   Poor Remote;   Poor  Judgement:  Impaired  Insight:  Shallow  Psychomotor Activity:  Normal  Concentration:  Poor  Recall:  Poor  Fund of Knowledge:Poor  Language: Fair  Akathisia:  No  Handed:  Right  AIMS (if indicated):     Assets:  Communication Skills Desire for Improvement Financial Resources/Insurance Housing Intimacy Physical Health Resilience Social Support  Sleep:  Number of Hours: 5.3  Cognition: WNL  ADL's:  Intact   Mental Status Per  Nursing Assessment::   On Admission:  NA  Demographic Factors:  Male  Loss Factors: NA  Historical Factors: Prior suicide attempts and Impulsivity  Risk Reduction Factors:   Responsible for children under 45 years of age, Living with another person, especially a relative and Positive social support  Continued Clinical Symptoms:  Alcohol/Substance Abuse/Dependencies  Cognitive Features That Contribute To Risk:  None    Suicide Risk:  Minimal: No identifiable suicidal ideation.  Patients presenting with no risk factors but with morbid ruminations; may be classified as minimal risk based on the severity of the depressive symptoms    Plan Of Care/Follow-up recommendations:  Activity:  as tolerated Diet:  regular Other:  keep follow up appointments  Kristine Linea, MD 11/18/2018, 10:21 AM

## 2018-11-18 NOTE — BHH Suicide Risk Assessment (Signed)
BHH INPATIENT:  Family/Significant Other Suicide Prevention Education  Suicide Prevention Education:  Education Completed; Geza Hargan, sister, [phone] has been identified by the patient as the family member/significant other with whom the patient will be residing, and identified as the person(s) who will aid the patient in the event of a mental health crisis (suicidal ideations/suicide attempt).  With written consent from the patient, the family member/significant other has been provided the following suicide prevention education, prior to the and/or following the discharge of the patient.  The suicide prevention education provided includes the following:  Suicide risk factors  Suicide prevention and interventions  National Suicide Hotline telephone number  [facility] assessment telephone number  [facility] Emergency Assistance 911  [facility] and/or Residential Mobile Crisis Unit telephone number  Request made of family/significant other to:  Remove weapons (e.g., guns, rifles, knives), all items previously/currently identified as safety concern.    Remove drugs/medications (over-the-counter, prescriptions, illicit drugs), all items previously/currently identified as a safety concern.  The family member/significant other verbalizes understanding of the suicide prevention education information provided.  The family member/significant other agrees to remove the items of safety concern listed above.  Harden Mo 11/18/2018, 1:11 PM

## 2018-11-18 NOTE — Plan of Care (Signed)
Patient is adjusting well in the unit, attended groups with good participation , contract for safety and denies and SI/HI/AVH, mood is good and affect is bright , maintaining safety in the unit , 15 minute safety rounding is  maintained no distress.   Problem: Education: Goal: Knowledge of [facility] Education information/materials will improve Outcome: Progressing   Problem: Coping: Goal: Coping ability will improve Outcome: Progressing Goal: Will verbalize feelings Outcome: Progressing   Problem: Self-Concept: Goal: Ability to disclose and discuss suicidal ideas will improve Outcome: Progressing

## 2018-11-18 NOTE — BHH Group Notes (Signed)
Emotional Regulation 11/18/2018 1PM  Type of Therapy/Topic:  Group Therapy:  Emotion Regulation  Participation Level:  Minimal   Description of Group:   The purpose of this group is to assist patients in learning to regulate negative emotions and experience positive emotions. Patients will be guided to discuss ways in which they have been vulnerable to their negative emotions. These vulnerabilities will be juxtaposed with experiences of positive emotions or situations, and patients will be challenged to use positive emotions to combat negative ones. Special emphasis will be placed on coping with negative emotions in conflict situations, and patients will process healthy conflict resolution skills.  Therapeutic Goals: 1. Patient will identify two positive emotions or experiences to reflect on in order to balance out negative emotions 2. Patient will label two or more emotions that they find the most difficult to experience 3. Patient will demonstrate positive conflict resolution skills through discussion and/or role plays  Summary of Patient Progress:   Minimal interaction with group members, did participate in icebreaker.    Therapeutic Modalities:   Cognitive Behavioral Therapy Feelings Identification Dialectical Behavioral Therapy   Suzan Slick, LCSW 11/18/2018 2:02 PM

## 2018-11-18 NOTE — Tx Team (Signed)
Interdisciplinary Treatment and Diagnostic Plan Update  11/18/2018 Time of Session: 10:30AM Javier Glover MRN: [id number]  Principal Diagnosis: MDD (major depressive disorder), recurrent severe, without psychosis (HCC)  Secondary Diagnoses: Principal Problem:   MDD (major depressive disorder), recurrent severe, without psychosis (HCC) Active Problems:   Suicidal behavior   Cocaine use disorder, moderate, dependence (HCC)   Alcohol use disorder, severe, dependence (HCC)   Cannabis use disorder, moderate, dependence (HCC)   Amphetamine use disorder, severe, dependence (HCC)   Tobacco use disorder   Intellectual disability   Current Medications:  Current Facility-Administered Medications  Medication Dose Route Frequency Provider Last Rate Last Dose  . acetaminophen (TYLENOL) tablet 650 mg  650 mg Oral Q6H PRN Mariel CraftMaurer, Sheila M, MD      . alum & mag hydroxide-simeth (MAALOX/MYLANTA) 200-200-20 MG/5ML suspension 30 mL  30 mL Oral Q4H PRN Mariel CraftMaurer, Sheila M, MD      . hydrOXYzine (ATARAX/VISTARIL) tablet 50 mg  50 mg Oral TID PRN Mariel CraftMaurer, Sheila M, MD      . LORazepam (ATIVAN) tablet 1 mg  1 mg Oral Q4H PRN Mariel CraftMaurer, Sheila M, MD      . magnesium hydroxide (MILK OF MAGNESIA) suspension 30 mL  30 mL Oral Daily PRN Mariel CraftMaurer, Sheila M, MD      . traZODone (DESYREL) tablet 50 mg  50 mg Oral QHS Mariel CraftMaurer, Sheila M, MD   50 mg at 11/17/18 2126   PTA Medications: No medications prior to admission.    Patient Stressors: Financial difficulties Health problems Substance abuse  Patient Strengths: Ability for insight Capable of independent living Supportive family/friends  Treatment Modalities: Medication Management, Group therapy, Case management,  1 to 1 session with clinician, Psychoeducation, Recreational therapy.   Physician Treatment Plan for Primary Diagnosis: MDD (major depressive disorder), recurrent severe, without psychosis (HCC) Long Term Goal(s): Improvement in symptoms so as ready  for discharge Improvement in symptoms so as ready for discharge   Short Term Goals: Ability to identify changes in lifestyle to reduce recurrence of condition will improve Ability to verbalize feelings will improve Ability to disclose and discuss suicidal ideas Ability to demonstrate self-control will improve Ability to identify and develop effective coping behaviors will improve Ability to maintain clinical measurements within normal limits will improve Compliance with prescribed medications will improve Ability to identify triggers associated with substance abuse/mental health issues will improve Ability to identify changes in lifestyle to reduce recurrence of condition will improve Ability to demonstrate self-control will improve Ability to identify triggers associated with substance abuse/mental health issues will improve  Medication Management: Evaluate patient's response, side effects, and tolerance of medication regimen.  Therapeutic Interventions: 1 to 1 sessions, Unit Group sessions and Medication administration.  Evaluation of Outcomes: Adequate for Discharge  Physician Treatment Plan for Secondary Diagnosis: Principal Problem:   MDD (major depressive disorder), recurrent severe, without psychosis (HCC) Active Problems:   Suicidal behavior   Cocaine use disorder, moderate, dependence (HCC)   Alcohol use disorder, severe, dependence (HCC)   Cannabis use disorder, moderate, dependence (HCC)   Amphetamine use disorder, severe, dependence (HCC)   Tobacco use disorder   Intellectual disability  Long Term Goal(s): Improvement in symptoms so as ready for discharge Improvement in symptoms so as ready for discharge   Short Term Goals: Ability to identify changes in lifestyle to reduce recurrence of condition will improve Ability to verbalize feelings will improve Ability to disclose and discuss suicidal ideas Ability to demonstrate self-control will improve Ability to identify  and develop effective coping behaviors will improve Ability to maintain clinical measurements within normal limits will improve Compliance with prescribed medications will improve Ability to identify triggers associated with substance abuse/mental health issues will improve Ability to identify changes in lifestyle to reduce recurrence of condition will improve Ability to demonstrate self-control will improve Ability to identify triggers associated with substance abuse/mental health issues will improve     Medication Management: Evaluate patient's response, side effects, and tolerance of medication regimen.  Therapeutic Interventions: 1 to 1 sessions, Unit Group sessions and Medication administration.  Evaluation of Outcomes: Adequate for Discharge   RN Treatment Plan for Primary Diagnosis: MDD (major depressive disorder), recurrent severe, without psychosis (HCC) Long Term Goal(s): Knowledge of disease and therapeutic regimen to maintain health will improve  Short Term Goals: Ability to demonstrate self-control, Ability to verbalize feelings will improve, Ability to disclose and discuss suicidal ideas, Ability to identify and develop effective coping behaviors will improve and Compliance with prescribed medications will improve  Medication Management: RN will administer medications as ordered by provider, will assess and evaluate patient's response and provide education to patient for prescribed medication. RN will report any adverse and/or side effects to prescribing provider.  Therapeutic Interventions: 1 on 1 counseling sessions, Psychoeducation, Medication administration, Evaluate responses to treatment, Monitor vital signs and CBGs as ordered, Perform/monitor CIWA, COWS, AIMS and Fall Risk screenings as ordered, Perform wound care treatments as ordered.  Evaluation of Outcomes: Adequate for Discharge   LCSW Treatment Plan for Primary Diagnosis: MDD (major depressive disorder),  recurrent severe, without psychosis (HCC) Long Term Goal(s): Safe transition to appropriate next level of care at discharge, Engage patient in therapeutic group addressing interpersonal concerns.  Short Term Goals: Engage patient in aftercare planning with referrals and resources, Increase social support, Increase ability to appropriately verbalize feelings and Identify triggers associated with mental health/substance abuse issues  Therapeutic Interventions: Assess for all discharge needs, 1 to 1 time with Social worker, Explore available resources and support systems, Assess for adequacy in community support network, Educate family and significant other(s) on suicide prevention, Complete Psychosocial Assessment, Interpersonal group therapy.  Evaluation of Outcomes: Adequate for Discharge   Progress in Treatment: Attending groups: Yes. Participating in groups: Yes. Taking medication as prescribed: Yes. Toleration medication: Yes. Family/Significant other contact made: Yes, individual(s) contacted:  CSW completed SPE with sister.  Patient understands diagnosis: Yes. Discussing patient identified problems/goals with staff: Yes. Medical problems stabilized or resolved: Yes. Denies suicidal/homicidal ideation: Yes. Issues/concerns per patient self-inventory: No. Other: none  New problem(s) identified: No, Describe:  none  New Short Term/Long Term Goal(s): detox, medication management for mood stabilization; elimination of SI thoughts; development of comprehensive mental wellness/sobriety plan.  Patient Goals:  "get off the drugs and alcohol"  Discharge Plan or Barriers: Patient [facility] 11/23/2018 at 7:15.  Patient reports that sister is providing transportation home.  Patient identifies no barriers.   Reason for Continuation of Hospitalization: Anxiety Depression Suicidal ideation  Estimated Length of Stay:  Expected to be discharged  11/18/2018  Attendees: Patient: Javier Glover 11/18/2018 1:12 PM  Physician: Dr. Jennet MaduroPucilowska, MD 11/18/2018 1:12 PM  Nursing: Milas HockShatara Powell, RN 11/18/2018 1:12 PM  RN Care Manager: Garret ReddishShay Outlaw, Drue FlirtRS, LRT 11/18/2018 1:12 PM  Social Worker:  Penni HomansMichaela Shaquil Aldana, MSW, LCSW 11/18/2018 1:12 PM  Recreational Therapist:  11/18/2018 1:12 PM  Other:  11/18/2018 1:12 PM  Other:  11/18/2018 1:12 PM  Other: 11/18/2018 1:12 PM    Scribe for  Treatment Team: Harden Mo, LCSW 11/18/2018 1:12 PM

## 2018-11-18 NOTE — Progress Notes (Addendum)
D: Patient is aware of  Discharge this shift .Patient denies suicidal /homicidal ideations. Patient received all belongings brought in  A: No Storage medications. Writer reviewed Discharge Summary, Suicide Risk Assessment, and Transitional Record. Patient Aware  Of follow up appointment . Patient did not receive any prescriptions. R: Patient left unit with no questions  Or concerns  With sister

## 2019-03-26 ENCOUNTER — Other Ambulatory Visit: Payer: Self-pay

## 2019-03-26 DIAGNOSIS — F17210 Nicotine dependence, cigarettes, uncomplicated: Secondary | ICD-10-CM | POA: Diagnosis not present

## 2019-03-26 DIAGNOSIS — F10920 Alcohol use, unspecified with intoxication, uncomplicated: Secondary | ICD-10-CM | POA: Insufficient documentation

## 2019-03-26 DIAGNOSIS — F1994 Other psychoactive substance use, unspecified with psychoactive substance-induced mood disorder: Secondary | ICD-10-CM | POA: Diagnosis not present

## 2019-03-26 DIAGNOSIS — R45851 Suicidal ideations: Secondary | ICD-10-CM | POA: Diagnosis not present

## 2019-03-26 DIAGNOSIS — Z765 Malingerer [conscious simulation]: Secondary | ICD-10-CM | POA: Insufficient documentation

## 2019-03-26 DIAGNOSIS — R51 Headache: Secondary | ICD-10-CM | POA: Diagnosis present

## 2019-03-26 DIAGNOSIS — F1410 Cocaine abuse, uncomplicated: Secondary | ICD-10-CM | POA: Diagnosis not present

## 2019-03-26 NOTE — ED Triage Notes (Signed)
Patient c/o SI, reports thoughts, denies plan. Patient reports hx of substance abuse, last used crack/cocaine today. Patient c/o headache.

## 2019-03-27 ENCOUNTER — Emergency Department
Admission: EM | Admit: 2019-03-27 | Discharge: 2019-03-27 | Disposition: A | Discharge status: Home / Self Care | Payer: Medicaid Other | Attending: Emergency Medicine | Admitting: Emergency Medicine

## 2019-03-27 ENCOUNTER — Emergency Department: Payer: Medicaid Other

## 2019-03-27 DIAGNOSIS — F10920 Alcohol use, unspecified with intoxication, uncomplicated: Secondary | ICD-10-CM

## 2019-03-27 DIAGNOSIS — F1410 Cocaine abuse, uncomplicated: Secondary | ICD-10-CM

## 2019-03-27 DIAGNOSIS — F1994 Other psychoactive substance use, unspecified with psychoactive substance-induced mood disorder: Secondary | ICD-10-CM

## 2019-03-27 DIAGNOSIS — Z765 Malingerer [conscious simulation]: Secondary | ICD-10-CM

## 2019-03-27 LAB — CBC
HCT: 33.1 % — ABNORMAL LOW (ref 39.0–52.0)
Hemoglobin: 11.5 g/dL — ABNORMAL LOW (ref 13.0–17.0)
MCH: 27.1 pg (ref 26.0–34.0)
MCHC: 34.7 g/dL (ref 30.0–36.0)
MCV: 78.1 fL — ABNORMAL LOW (ref 80.0–100.0)
Platelets: 251 10*3/uL (ref 150–400)
RBC: 4.24 MIL/uL (ref 4.22–5.81)
RDW: 13.1 % (ref 11.5–15.5)
WBC: 11.3 10*3/uL — ABNORMAL HIGH (ref 4.0–10.5)
nRBC: 0 % (ref 0.0–0.2)

## 2019-03-27 LAB — COMPREHENSIVE METABOLIC PANEL
ALT: 35 U/L (ref 0–44)
AST: 29 U/L (ref 15–41)
Albumin: 5 g/dL (ref 3.5–5.0)
Alkaline Phosphatase: 61 U/L (ref 38–126)
Anion gap: 14 (ref 5–15)
BUN: 18 mg/dL (ref 6–20)
CO2: 21 mmol/L — ABNORMAL LOW (ref 22–32)
Calcium: 9 mg/dL (ref 8.9–10.3)
Chloride: 108 mmol/L (ref 98–111)
Creatinine, Ser: 0.89 mg/dL (ref 0.61–1.24)
GFR calc Af Amer: >60 mL/min (ref >60)
GFR calc non Af Amer: >60 mL/min (ref >60)
Glucose, Bld: 105 mg/dL — ABNORMAL HIGH (ref 70–99)
Potassium: 3.4 mmol/L — ABNORMAL LOW (ref 3.5–5.1)
Sodium: 143 mmol/L (ref 135–145)
Total Bilirubin: 0.8 mg/dL (ref 0.3–1.2)
Total Protein: 8.6 g/dL — ABNORMAL HIGH (ref 6.5–8.1)

## 2019-03-27 LAB — SALICYLATE LEVEL: Salicylate Lvl: <7 mg/dL (ref 2.8–30.0)

## 2019-03-27 LAB — ETHANOL: Alcohol, Ethyl (B): 176 mg/dL — ABNORMAL HIGH (ref <10)

## 2019-03-27 LAB — ACETAMINOPHEN LEVEL: Acetaminophen (Tylenol), Serum: <10 ug/mL — ABNORMAL LOW (ref 10–30)

## 2019-03-27 NOTE — Discharge Instructions (Addendum)
Drugs and alcohol can affect your mood. Call the number provided for appointment to help you with substance abuse. Return to the ER for worsening symptoms, feelings of hurting yourself or others, other concerns.

## 2019-03-27 NOTE — ED Notes (Signed)
SOC returned call after interview and recommends discharge with out-patient resources.

## 2019-03-27 NOTE — ED Notes (Signed)
SOC called report given, SOC machine in patients room.  Pt. Woke up, pt. Given OJ upon request.

## 2019-03-27 NOTE — ED Notes (Signed)
Pt. Going home with resources and possible ride with [employer].

## 2019-03-27 NOTE — ED Notes (Signed)
Pt. Finished SOC, SOC machine taken out of room, pt. Sleeping.

## 2019-03-27 NOTE — ED Notes (Signed)
TTS machine removed from room.  Pt. Still indicated he had SI thoughts.

## 2019-03-27 NOTE — ED Notes (Signed)
TTS in video conference talking to patient at this time.

## 2019-03-27 NOTE — ED Notes (Signed)
TTS has attempted to assess the pt via telepsyh. Pt is alert but unresponsive to verbal prompts. This Clinical research associate as spoken with the pts nurse and requested assistance with arousing the pt although he is non-verbal. Pt places the sheet over his head when engaged.

## 2019-03-27 NOTE — ED Notes (Addendum)
Pt. States having SI thoughts today.  Pt. States "feeling very anxious".  Pt. Denies taking psych. medications or seeing therapist.  Pt. Calm and cooperative at this time.  Pt. Has hx of suicidal behavior.

## 2019-03-27 NOTE — ED Provider Notes (Signed)
[facility] Emergency Department Provider Note   ____________________________________________   First MD Initiated Contact with Patient 03/27/19 [phone]     (approximate)  I have reviewed the triage vital signs and the nursing notes.   HISTORY  Chief Complaint Suicidal    HPI Javier Glover is a 33 y.o. male who presents to the ED voluntarily with a chief complaint of SI. Admits to cocaine use. Complains of frontal HA. Denies fall/trauma/injury. No plan for SI. Denies active HI/AH/VH. Denies recent fever, cough, chest pain, shortness of breath, abdominal pain, nausea or vomiting. Denies recent travel or exposure to persons diagnosed with Coronavirus.       Past Medical History:  Diagnosis Date  . Seizures [facility])     Patient Active Problem List   Diagnosis Date Noted  . 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 Cannabis use disorder, moderate, dependence (HCC) 11/17/2018  . 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 Tobacco use disorder 11/17/2018  . Intellectual disability 11/17/2018  . Suicidal behavior 11/16/2018  . Cocaine use disorder, moderate, dependence (HCC) 11/16/2018  . Alcohol use disorder, severe, dependence (HCC) 11/16/2018    Past Surgical History:  Procedure Laterality Date  . FRACTURE SURGERY      Prior to Admission medications   Not on File    Allergies Ibuprofen  No family history on file.  Social History Social History   Tobacco Use  . Smoking status: Current Every Day Smoker    Packs/day: 0.50    Years: 14.00    Pack years: 7.00    Types: Cigarettes  . Smokeless tobacco: Never Used  Substance Use Topics  . Alcohol use: Yes    Comment: occ  . Drug use: Yes    Types: Cocaine    Review of Systems  Constitutional: No fever/chills Eyes: No visual changes. ENT: No sore throat. Cardiovascular: Denies chest pain. Respiratory: Denies  shortness of breath. Gastrointestinal: No abdominal pain.  No nausea, no vomiting.  No diarrhea.  No constipation. Genitourinary: Negative for dysuria. Musculoskeletal: Negative for back pain. Skin: Negative for rash. Neurological: Positive for headache. Negative for focal weakness or numbness. Psychiatric: Positive for depression with SI.  ____________________________________________   PHYSICAL EXAM:  VITAL SIGNS: ED Triage Vitals [03/27/19 0000]  Enc Vitals Group     BP 114/77     Pulse Rate (!) 117     Resp 18     Temp 98.5 F (36.9 C)     Temp src      SpO2 95 %     Weight 171 lb 15.3 oz (78 kg)     Height      Head Circumference      Peak Flow      Pain Score 9     Pain Loc      Pain Edu?      Excl. in GC?     Constitutional: Asleep, awakened for exam. Alert and oriented. Well appearing and in no acute distress. Eyes: Conjunctivae are normal. PERRL. EOMI. Head: Atraumatic. Nose: No congestion/rhinnorhea. Mouth/Throat: Mucous membranes are moist.  Oropharynx non-erythematous. Neck: No stridor. No carotid bruits. Supple neck without meningismus. Cardiovascular: Normal rate, regular rhythm. Grossly normal heart sounds.  Good peripheral circulation. Respiratory: Normal respiratory effort.  No retractions. Lungs CTAB. Gastrointestinal: Soft and nontender. No distention. No abdominal bruits. No CVA tenderness. Musculoskeletal: No lower extremity tenderness nor edema.  No joint effusions. Neurologic:  Normal speech  and language. No gross focal neurologic deficits are appreciated. No gait instability. Skin:  Skin is warm, dry and intact. No rash noted. Psychiatric: Mood and affect are flat. Speech and behavior are normal.  ____________________________________________   LABS (all labs ordered are listed, but only abnormal results are displayed)  Labs Reviewed  COMPREHENSIVE METABOLIC PANEL - Abnormal; Notable for the following components:      Result Value    Potassium 3.4 (*)    CO2 21 (*)    Glucose, Bld 105 (*)    Total Protein 8.6 (*)    All other components within normal limits  ETHANOL - Abnormal; Notable for the following components:   Alcohol, Ethyl (B) 176 (*)    All other components within normal limits  ACETAMINOPHEN LEVEL - Abnormal; Notable for the following components:   Acetaminophen (Tylenol), Serum <10 (*)    All other components within normal limits  CBC - Abnormal; Notable for the following components:   WBC 11.3 (*)    Hemoglobin 11.5 (*)    HCT 33.1 (*)    MCV 78.1 (*)    All other components within normal limits  SALICYLATE LEVEL  URINE DRUG SCREEN, QUALITATIVE ([facility] ONLY)   ____________________________________________  EKG  None ____________________________________________  RADIOLOGY  ED MD interpretation:  No ICH  Official radiology report(s): Ct Head Wo Contrast  Result Date: 03/27/2019 CLINICAL DATA:  Anxiety, suicidal ideation EXAM: CT HEAD WITHOUT CONTRAST TECHNIQUE: Contiguous axial images were obtained from the base of the skull through the vertex without intravenous contrast. COMPARISON:  CT 05/17/2018 FINDINGS: Brain: No evidence of acute infarction, hemorrhage, hydrocephalus, extra-axial collection or mass lesion/mass effect. Vascular: No hyperdense vessel or unexpected calcification. Skull: Normal. Negative for fracture or focal lesion. Sinuses/Orbits: Mucosal thickening in the ethmoid and sphenoid sinuses Other: None IMPRESSION: Negative non contrasted CT appearance of the brain Electronically Signed   By: Jasmine Pang M.D.   On: 03/27/2019 02:00    ____________________________________________   PROCEDURES  Procedure(s) performed (including Critical Care):  Procedures   ____________________________________________   INITIAL IMPRESSION / ASSESSMENT AND PLAN / ED COURSE  As part of my medical decision making, I reviewed the following data within the electronic MEDICAL RECORD NUMBER Nursing  notes reviewed and incorporated, Labs reviewed, Old chart reviewed, Radiograph reviewed, A consult was requested and obtained from this/these consultant(s) Psychiatry and Notes from prior ED visits     Javier Glover was evaluated in Emergency Department on 03/27/2019 for the symptoms described in the history of present illness. He was evaluated in the context of the global COVID-19 pandemic, which necessitated consideration that the patient might be at risk for infection with the SARS-CoV-2 virus that causes COVID-19. Institutional protocols and algorithms that pertain to the evaluation of patients at risk for COVID-19 are in a state of rapid change based on information released by regulatory bodies including the CDC and federal and state organizations. These policies and algorithms were followed during the patient's care in the ED.   33 year old male who presents voluntarily for SI; cocaine use. Contracts for safety while in the emergency department. Will keep patient voluntary, obtain CT Head for complaint of headache, and consult [facility] psychiatry for evaluation and disposition.  Clinical Course as of Mar 26 440  Sat Mar 27, 2019  1610 Patient was evaluated by [facility] psychiatrist Dr. Orpah Clinton who deems him psychiatrically stable for discharge home. Patient refused TTS evaluation. Since he has been seen by the psychiatrist who recommends  discharge, will cancel TTS consult. Strict return precautions given. Patient verbalizes understanding and agrees with plan of care.   [JS]    Clinical Course User Index [JS] Irean HongSung, Jade J, MD     ____________________________________________   FINAL CLINICAL IMPRESSION(S) / ED DIAGNOSES  Final diagnoses:  Alcoholic intoxication without complication (HCC)  Cocaine abuse (HCC)  Substance induced mood disorder (HCC)  Malingerer (conscious simulation)     ED Discharge Orders    None       Note:  This document was prepared using Dragon voice recognition  software and may include unintentional dictation errors.   Irean HongSung, Jade J, MD 03/27/19 [phone]

## 2019-06-10 ENCOUNTER — Emergency Department: Payer: Medicaid Other

## 2019-06-10 ENCOUNTER — Emergency Department
Admission: EM | Admit: 2019-06-10 | Discharge: 2019-06-10 | Disposition: A | Discharge status: Home / Self Care | Payer: Medicaid Other | Attending: Emergency Medicine | Admitting: Emergency Medicine

## 2019-06-10 ENCOUNTER — Other Ambulatory Visit: Payer: Self-pay

## 2019-06-10 ENCOUNTER — Encounter: Payer: Self-pay | Admitting: Emergency Medicine

## 2019-06-10 DIAGNOSIS — R0602 Shortness of breath: Secondary | ICD-10-CM | POA: Insufficient documentation

## 2019-06-10 DIAGNOSIS — E860 Dehydration: Secondary | ICD-10-CM | POA: Insufficient documentation

## 2019-06-10 DIAGNOSIS — R1084 Generalized abdominal pain: Secondary | ICD-10-CM | POA: Diagnosis present

## 2019-06-10 DIAGNOSIS — F1410 Cocaine abuse, uncomplicated: Secondary | ICD-10-CM | POA: Diagnosis not present

## 2019-06-10 DIAGNOSIS — R112 Nausea with vomiting, unspecified: Secondary | ICD-10-CM | POA: Diagnosis present

## 2019-06-10 DIAGNOSIS — F17210 Nicotine dependence, cigarettes, uncomplicated: Secondary | ICD-10-CM | POA: Diagnosis not present

## 2019-06-10 DIAGNOSIS — Z20828 Contact with and (suspected) exposure to other viral communicable diseases: Secondary | ICD-10-CM | POA: Insufficient documentation

## 2019-06-10 LAB — CBC
HCT: 36.7 % — ABNORMAL LOW (ref 39.0–52.0)
Hemoglobin: 12.3 g/dL — ABNORMAL LOW (ref 13.0–17.0)
MCH: 27.2 pg (ref 26.0–34.0)
MCHC: 33.5 g/dL (ref 30.0–36.0)
MCV: 81.2 fL (ref 80.0–100.0)
Platelets: 253 10*3/uL (ref 150–400)
RBC: 4.52 MIL/uL (ref 4.22–5.81)
RDW: 13.5 % (ref 11.5–15.5)
WBC: 6.1 10*3/uL (ref 4.0–10.5)
nRBC: 0 % (ref 0.0–0.2)

## 2019-06-10 LAB — COMPREHENSIVE METABOLIC PANEL
ALT: 17 U/L (ref 0–44)
AST: 19 U/L (ref 15–41)
Albumin: 4.4 g/dL (ref 3.5–5.0)
Alkaline Phosphatase: 49 U/L (ref 38–126)
Anion gap: 11 (ref 5–15)
BUN: 16 mg/dL (ref 6–20)
CO2: 25 mmol/L (ref 22–32)
Calcium: 9.5 mg/dL (ref 8.9–10.3)
Chloride: 102 mmol/L (ref 98–111)
Creatinine, Ser: 0.7 mg/dL (ref 0.61–1.24)
GFR calc Af Amer: >60 mL/min (ref >60)
GFR calc non Af Amer: >60 mL/min (ref >60)
Glucose, Bld: 102 mg/dL — ABNORMAL HIGH (ref 70–99)
Potassium: 3.3 mmol/L — ABNORMAL LOW (ref 3.5–5.1)
Sodium: 138 mmol/L (ref 135–145)
Total Bilirubin: 0.4 mg/dL (ref 0.3–1.2)
Total Protein: 7.5 g/dL (ref 6.5–8.1)

## 2019-06-10 LAB — SARS CORONAVIRUS 2 (TAT 6-24 HRS): SARS Coronavirus 2: NEGATIVE

## 2019-06-10 LAB — LIPASE, BLOOD: Lipase: 24 U/L (ref 11–51)

## 2019-06-10 MED ORDER — PANTOPRAZOLE SODIUM 40 MG IV SOLR
40.0000 mg | Freq: Once | INTRAVENOUS | Status: AC
  Administered 2019-06-10: 09:00:00 40 mg via INTRAVENOUS
  Filled 2019-06-10: qty 40

## 2019-06-10 MED ORDER — IOHEXOL 300 MG/ML  SOLN
100.0000 mL | Freq: Once | INTRAMUSCULAR | Status: AC | PRN
  Administered 2019-06-10: 09:00:00 100 mL via INTRAVENOUS

## 2019-06-10 MED ORDER — SODIUM CHLORIDE 0.9 % IV BOLUS
1000.0000 mL | Freq: Once | INTRAVENOUS | Status: AC
  Administered 2019-06-10: 1000 mL via INTRAVENOUS

## 2019-06-10 MED ORDER — ONDANSETRON HCL 4 MG/2ML IJ SOLN
4.0000 mg | Freq: Once | INTRAMUSCULAR | Status: AC
  Administered 2019-06-10: 4 mg via INTRAVENOUS
  Filled 2019-06-10: qty 2

## 2019-06-10 MED ORDER — OMEPRAZOLE 20 MG PO CPDR: 20.0000 mg | DELAYED_RELEASE_CAPSULE | Freq: Every day | ORAL | 0 refills | Status: DC

## 2019-06-10 MED ORDER — ONDANSETRON HCL 4 MG PO TABS: 4.0000 mg | ORAL_TABLET | Freq: Every day | ORAL | 0 refills | Status: DC | PRN

## 2019-06-10 NOTE — ED Provider Notes (Addendum)
[facility] Emergency Department Provider Note  ____________________________________________   First MD Initiated Contact with Patient 06/10/19 0815     (approximate)  I have reviewed the triage vital signs and the nursing notes.   HISTORY  Chief Complaint Abdominal Pain and Emesis    HPI Lorenz CoasterJames A Steedley is a 33 y.o. male with history of seizures who presents from home with generalized abdominal pain and vomiting that has been going on intermittently for 2 weeks.. Patient has a history of stomach cancer and ulcer but has not been able to follow-up due to being in prison. Unclear how this was diagnosed because does not think he had endoscopy. Patient presents with his fiance.  Patient does have a h/o of drinking.  Since being with her he is cut back significantly.  He is continued to have occasional vomiting that was originally nonbloody and now has some blood speaks in It.  No vomiting up diffuse blood. He is not been on any treatment for this.  He is having increasing pain with eating.  The pain is severe, constant, nothing makes it better, worse with eating.  Denies any fevers.  Also endorsing some mild shortness of breath.            Past Medical History:  Diagnosis Date  . Seizures [facility](HCC)     Patient Active Problem List   Diagnosis Date Noted  . 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 Cannabis use disorder, moderate, dependence (HCC) 11/17/2018  . 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 Tobacco use disorder 11/17/2018  . Intellectual disability 11/17/2018  . Suicidal behavior 11/16/2018  . Cocaine use disorder, moderate, dependence (HCC) 11/16/2018  . Alcohol use disorder, severe, dependence (HCC) 11/16/2018    Past Surgical History:  Procedure Laterality Date  . FRACTURE SURGERY      Prior to Admission medications   Not on File    Allergies Ibuprofen  No  family history on file.  Social History Social History   Tobacco Use  . Smoking status: Current Every Day Smoker    Packs/day: 0.50    Years: 14.00    Pack years: 7.00    Types: Cigarettes  . Smokeless tobacco: Never Used  Substance Use Topics  . Alcohol use: Yes    Comment: occ  . Drug use: Yes    Types: Cocaine      Review of Systems Constitutional: No fever/chills Eyes: No visual changes. ENT: No sore throat. Cardiovascular: Denies chest pain. Respiratory: Positive shortness of breath Gastrointestinal: Positive abdominal pain and vomiting Genitourinary: Negative for dysuria. Musculoskeletal: Negative for back pain. Skin: Negative for rash. Neurological: Negative for headaches, focal weakness or numbness. All other ROS negative ____________________________________________   PHYSICAL EXAM:  VITAL SIGNS: ED Triage Vitals  Enc Vitals Group     BP 06/10/19 0642 135/80     Pulse Rate 06/10/19 0642 66     Resp 06/10/19 0642 20     Temp 06/10/19 0642 97.9 F (36.6 C)     Temp Source 06/10/19 0642 Oral     SpO2 06/10/19 0642 98 %     Weight 06/10/19 0644 177 lb 0.5 oz (80.3 kg)     Height 06/10/19 0644 6' (1.829 m)     Head Circumference --      Peak Flow --      Pain Score --      Pain Loc --  Pain Edu? --      Excl. in GC? --     Constitutional: Alert and oriented. Well appearing and in no acute distress. Eyes: Conjunctivae are normal. EOMI. Head: Atraumatic. Nose: No congestion/rhinnorhea. Mouth/Throat: Mucous membranes are moist.   Neck: No stridor. Trachea Midline. FROM Cardiovascular: Normal rate, regular rhythm. Grossly normal heart sounds.  Good peripheral circulation. Respiratory: Normal respiratory effort.  No retractions. Lungs CTAB. Gastrointestinal: Soft with mild generalized tenderness no distention. No abdominal bruits.  Musculoskeletal: No lower extremity tenderness nor edema.  No joint effusions. Neurologic:  Normal speech and  language. No gross focal neurologic deficits are appreciated.  Skin:  Skin is warm, dry and intact. No rash noted. Psychiatric: Mood and affect are normal. Speech and behavior are normal. GU: Deferred   ____________________________________________   LABS (all labs ordered are listed, but only abnormal results are displayed)  Labs Reviewed  COMPREHENSIVE METABOLIC PANEL - Abnormal; Notable for the following components:      Result Value   Potassium 3.3 (*)    Glucose, Bld 102 (*)    All other components within normal limits  CBC - Abnormal; Notable for the following components:   Hemoglobin 12.3 (*)    HCT 36.7 (*)    All other components within normal limits  LIPASE, BLOOD  URINALYSIS, COMPLETE (UACMP) WITH MICROSCOPIC   ____________________________________________   ED ECG REPORT I, Concha SeMary E Ceanna Wareing, the attending physician, personally viewed and interpreted this ECG.  EKG is sinus rate of 54, early repolarization, no T wave inversion, normal intervals ____________________________________________  RADIOLOGY Vela ProseI, Delno Blaisdell E Kayson Bullis, personally viewed and evaluated these images (plain radiographs) as part of my medical decision making, as well as reviewing the written report by the radiologist.  ED MD interpretation: Chest x-ray without evidence of pneumonia  Official radiology report(s): Dg Chest 2 View  Result Date: 06/10/2019 CLINICAL DATA:  Cough and shortness of breath EXAM: CHEST - 2 VIEW COMPARISON:  None. FINDINGS: Lungs are clear. Heart size and pulmonary vascularity are normal. No adenopathy. No bone lesions. IMPRESSION: No edema or consolidation. Electronically Signed   By: Bretta BangWilliam  Woodruff III M.D.   On: 06/10/2019 09:11   Ct Abdomen Pelvis W Contrast  Result Date: 06/10/2019 CLINICAL DATA:  Generalized abdominal pain and vomiting EXAM: CT ABDOMEN AND PELVIS WITH CONTRAST TECHNIQUE: Multidetector CT imaging of the abdomen and pelvis was performed using the standard  protocol following bolus administration of intravenous contrast. CONTRAST:  100mL OMNIPAQUE IOHEXOL 300 MG/ML  SOLN COMPARISON:  None. FINDINGS: Lower chest: Lung bases are clear. Hepatobiliary: No focal liver lesions are demonstrable. The gallbladder wall is not appreciably thickened. There is no biliary duct dilatation. Pancreas: There is no pancreatic mass or inflammatory focus. Spleen: No splenic lesions are evident. Adrenals/Urinary Tract: Adrenals bilaterally appear unremarkable. Kidneys bilaterally show no evident mass or hydronephrosis on either side. There is no evident renal or ureteral calculus on either side. Urinary bladder is midline with wall thickness within normal limits. Stomach/Bowel: There is no gastric or bowel wall thickening. No mesenteric thickening. No evident bowel obstruction. Terminal ileum appears unremarkable. There is no evident free air or portal venous air. Vascular/Lymphatic: No abdominal aortic aneurysm. No vascular lesions are evident. No adenopathy is appreciable in the abdomen or pelvis. Reproductive: Prostate and seminal vesicles are normal in size and contour. No evident pelvic mass. Other: Appendix appears normal. There is no evident abscess or ascites in the abdomen or pelvis. Musculoskeletal: There are no blastic or lytic  bone lesions. No intramuscular or abdominal wall lesions evident. IMPRESSION: 1. A cause for patient's symptoms has not been established with this study. 2. No bowel obstruction or bowel wall thickening. No abscess in the abdomen or pelvis. Appendix appears normal. ww 3. No evident renal or ureteral calculus. No hydronephrosis. Urinary bladder wall thickness within normal limits. Electronically Signed   By: Bretta BangWilliam  Woodruff III M.D.   On: 06/10/2019 09:00    ____________________________________________   PROCEDURES  Procedure(s) performed (including Critical Care):  Procedures   ____________________________________________   INITIAL  IMPRESSION / ASSESSMENT AND PLAN / ED COURSE  Lorenz CoasterJames A Halle was evaluated in Emergency Department on 06/10/2019 for the symptoms described in the history of present illness. He was evaluated in the context of the global COVID-19 pandemic, which necessitated consideration that the patient might be at risk for infection with the SARS-CoV-2 virus that causes COVID-19. Institutional protocols and algorithms that pertain to the evaluation of patients at risk for COVID-19 are in a state of rapid change based on information released by regulatory bodies including the CDC and federal and state organizations. These policies and algorithms were followed during the patient's care in the ED.     Patient is a 33 year old who presents with abdominal pain and occasional episodes of blood-tinged vomit.  This is most likely secondary to gastritis, ulcer from his alcohol use.  The vomiting could be Mallory-Weiss tears.  Lower suspicion for varices given no history of liver issues and does not sound like diffuse blood that he is vomiting.  However given his abdominal pain will get CT scan to evaluate for perforation, evidence of liver dysfunction, appendicitis, SBO.  Will give a dose of Protonix to cover for gastritis. For patient shortness of breath will get chest x-ray to evaluate for pneumonia as well send an outpatient coronavirus swab.  Lipase is normal making pancreatitis less likely.  LFTs are normal making cholecystitis less likely.  White count is normal which is reassuring.  Hemoglobin is at baseline at 12.3.  Chest x-ray was negative  CT abdomen is negative as well.  No evidence of cirrhosis to suggest variceal bleeding.  Patient has not had any vomiting while in the emergency department.  Patient is tolerating p.o.  Will give patient a GI referral, start on Protonix and Zofran discussed diet.  Patient tested for coronavirus given recently out of the prison and will stay quarantined until this results.  Discussed with fiancee and pt and they had no concerns with this plan and seemed very agreeable.  Per report from nurse after d/c pt's fiance was upset about d/c.  These concerns were never voiced to be during my workup so I am unclear what sparked this.  Upon hearing this, pt and wife had already left department.   ____________________________________________   FINAL CLINICAL IMPRESSION(S) / ED DIAGNOSES   Final diagnoses:  Nausea and vomiting, intractability of vomiting not specified, unspecified vomiting type      MEDICATIONS GIVEN DURING THIS VISIT:  Medications  sodium chloride 0.9 % bolus 1,000 mL (1,000 mLs Intravenous New Bag/Given 06/10/19 0919)  pantoprazole (PROTONIX) injection 40 mg (40 mg Intravenous Given 06/10/19 0920)  ondansetron (ZOFRAN) injection 4 mg (4 mg Intravenous Given 06/10/19 0920)  iohexol (OMNIPAQUE) 300 MG/ML solution 100 mL (100 mLs Intravenous Contrast Given 06/10/19 0847)     ED Discharge Orders         Ordered    omeprazole (PRILOSEC) 20 MG capsule  Daily  06/10/19 0945    ondansetron (ZOFRAN) 4 MG tablet  Daily PRN     06/10/19 0945           Note:  This document was prepared using Dragon voice recognition software and may include unintentional dictation errors.   Vanessa [place], MD 06/10/19 7902    Vanessa [place], MD 06/10/19 1022

## 2019-06-10 NOTE — ED Notes (Signed)
Spouse very demanding and rude with staff stating that she is a Marine scientist

## 2019-06-10 NOTE — ED Notes (Signed)
Pt appears pleased with care and pt spouse (later stated she was fiancee) is not happy - she refused to let pt answer for himself the entire visit and on discharge refused to let pt sign and she signed instead - she then threw the signature pad in the floor breaking it  She left room and kept stating racial/racist comments

## 2019-06-10 NOTE — ED Notes (Addendum)
Pt spouse demanding cab voucher - she refuses bus pass and is demanding to speak with pt relations Pt spouse is making racial comments and stating that she is not getting cab because "people here are racist" - pt spouse told pt that we get paid to clean up and that I need to do it Pt is asking for spouse to "speak your mind" she states "we pay yalls salary"

## 2019-06-10 NOTE — ED Triage Notes (Signed)
Pt presents to ED via EMS from home with c/o generalized abd pain with vomiting since Wednesday night. Slight tender with palpation. Denies similar symptoms in the past. Pt alert and calm with no distress noted. Pt states he noticed streaks of pink blood in his vomit one time.

## 2019-06-10 NOTE — ED Notes (Signed)
Pt spouse then went to officer desk in lobby and ask for bus pass

## 2019-06-10 NOTE — Discharge Instructions (Addendum)
Take the omeprazole to help decrease acid.  Take the Zofran to help with nausea.  Drink plenty of fluids.  It is possible you have an ulcer versus gastritis.  You need to follow-up with GI to set up an appointment.  Return to the ER for increasing in symptoms or any other concerns.  Stay quarantined until your results are back.

## 2019-06-10 NOTE — ED Notes (Addendum)
Pt reports vomiting blood x2-3 weeks - he has dx of "bleeding ulcers" and "stomach cancer" - he has had no follow up - pt has been out of town and just returned 2-3 weeks ago from [place] - pt spouse stated that pt does not have a PCP - spouse states she has been given him breathing treatments, nausea medication, and tums (her medication) - c/o severe abd pain - denies weight loss but reports SHOB and night sweats

## 2019-07-15 ENCOUNTER — Ambulatory Visit: Payer: Medicaid Other | Admitting: Gastroenterology

## 2019-07-15 ENCOUNTER — Encounter

## 2019-07-15 ENCOUNTER — Encounter: Payer: Self-pay | Admitting: *Deleted

## 2019-07-15 NOTE — Progress Notes (Deleted)
Gastroenterology Consultation  Referring Provider:     [facility] Primary Care Physician:  [facility] Primary Gastroenterologist:  Dr. Servando SnareWohl     Reason for Consultation:     Intractable nausea and  vomiting        HPI:   Lorenz CoasterJames A Hoffart is a 33 y.o. y/o male referred for consultation & management of intractable nausea and vomiting by Dr. Eli Phillipsenter, [facility].  This patient comes here today after being seen in the emergency room back in August.  The patient has been reported to have a seizure disorder and went to the emergency room for abdominal pain with vomiting that he reported at that time to have persisted for 2 weeks.  At the time of the visit to the emergency department he had reported that he has a history of peptic ulcer disease and gastric cancer but was unable to follow-up due to being in prison.  The patient has a diagnosis of antisocial behavior and suicidal ideations in the past.  The patient had a CT scan that did not show any sign of any thickening of the stomach wall or any abnormalities to explain his symptoms.  The patient also has a history of alcohol abuse in the past.  As reported in the emergency physician's note the patient does not know how he was diagnosed with the stomach issues since he does not recall ever having an upper endoscopy.  The vomiting was reported to be nonbloody at first but then had some spots of blood in it.   Past Medical History:  Diagnosis Date  . Seizures (HCC)     Past Surgical History:  Procedure Laterality Date  . FRACTURE SURGERY      Prior to Admission medications   Medication Sig Start Date End Date Taking? Authorizing Provider  omeprazole (PRILOSEC) 20 MG capsule Take 1 capsule (20 mg total) by mouth daily. 06/10/19 07/10/19  Concha SeFunke, Mary E, MD  ondansetron (ZOFRAN) 4 MG tablet Take 1 tablet (4 mg total) by mouth daily as needed for nausea or vomiting. 06/10/19 06/09/20  Concha SeFunke, Mary  E, MD    No family history on file.   Social History   Tobacco Use  . Smoking status: Current Every Day Smoker    Packs/day: 0.50    Years: 14.00    Pack years: 7.00    Types: Cigarettes  . Smokeless tobacco: Never Used  Substance Use Topics  . Alcohol use: Yes    Comment: occ  . Drug use: Yes    Types: Cocaine    Allergies as of 07/15/2019 - Review Complete 06/10/2019  Allergen Reaction Noted  . Ibuprofen Rash 10/09/2014    Review of Systems:    All systems reviewed and negative except where noted in HPI.   Physical Exam:  There were no vitals taken for this visit. No LMP for male patient. General:   Alert,  Well-developed, well-nourished, pleasant and cooperative in NAD Head:  Normocephalic and atraumatic. Eyes:  Sclera clear, no icterus.   Conjunctiva pink. Ears:  Normal auditory acuity. Nose:  No deformity, discharge, or lesions. Mouth:  No deformity or lesions,oropharynx pink & moist. Neck:  Supple; no masses or thyromegaly. Lungs:  Respirations even and unlabored.  Clear throughout to auscultation.   No wheezes, crackles, or rhonchi. No acute distress. Heart:  Regular rate and rhythm; no murmurs, clicks, rubs, or gallops. Abdomen:  Normal bowel sounds.  No bruits.  Soft,  non-tender and non-distended without masses, hepatosplenomegaly or hernias noted.  No guarding or rebound tenderness.  Negative Carnett sign.   Rectal:  Deferred.  Msk:  Symmetrical without gross deformities.  Good, equal movement & strength bilaterally. Pulses:  Normal pulses noted. Extremities:  No clubbing or edema.  No cyanosis. Neurologic:  Alert and oriented x3;  grossly normal neurologically. Skin:  Intact without significant lesions or rashes.  No jaundice. Lymph Nodes:  No significant cervical adenopathy. Psych:  Alert and cooperative. Normal mood and affect.  Imaging Studies: No results found.  Assessment and Plan:   TRENTIN KNAPPENBERGER is a 33 y.o. y/o male ***  Lucilla Lame, MD.  Marval Regal    Note: This dictation was prepared with Dragon dictation along with smaller phrase technology. Any transcriptional errors that result from this process are unintentional.

## 2019-08-09 ENCOUNTER — Inpatient Hospital Stay
Admission: AD | Admit: 2019-08-09 | Discharge: 2019-08-11 | DRG: 897 | Disposition: A | Discharge status: Home / Self Care | Payer: Medicaid Other | Attending: Psychiatry | Admitting: Psychiatry

## 2019-08-09 ENCOUNTER — Encounter: Payer: Self-pay | Admitting: Emergency Medicine

## 2019-08-09 ENCOUNTER — Other Ambulatory Visit: Payer: Self-pay

## 2019-08-09 ENCOUNTER — Emergency Department
Admission: EM | Admit: 2019-08-09 | Discharge: 2019-08-09 | Disposition: A | Discharge status: Other Acute Inpatient Hospital | Payer: Medicaid Other | Attending: Emergency Medicine | Admitting: Emergency Medicine

## 2019-08-09 DIAGNOSIS — Z20828 Contact with and (suspected) exposure to other viral communicable diseases: Secondary | ICD-10-CM | POA: Insufficient documentation

## 2019-08-09 DIAGNOSIS — F1020 Alcohol dependence, uncomplicated: Principal | ICD-10-CM | POA: Diagnosis present

## 2019-08-09 DIAGNOSIS — R45851 Suicidal ideations: Secondary | ICD-10-CM | POA: Diagnosis present

## 2019-08-09 DIAGNOSIS — F17210 Nicotine dependence, cigarettes, uncomplicated: Secondary | ICD-10-CM | POA: Insufficient documentation

## 2019-08-09 DIAGNOSIS — F10929 Alcohol use, unspecified with intoxication, unspecified: Secondary | ICD-10-CM

## 2019-08-09 DIAGNOSIS — Y908 Blood alcohol level of 240 mg/100 ml or more: Secondary | ICD-10-CM | POA: Diagnosis present

## 2019-08-09 DIAGNOSIS — R4689 Other symptoms and signs involving appearance and behavior: Secondary | ICD-10-CM | POA: Diagnosis not present

## 2019-08-09 DIAGNOSIS — F319 Bipolar disorder, unspecified: Secondary | ICD-10-CM | POA: Diagnosis not present

## 2019-08-09 DIAGNOSIS — F79 Unspecified intellectual disabilities: Secondary | ICD-10-CM | POA: Diagnosis present

## 2019-08-09 LAB — COMPREHENSIVE METABOLIC PANEL
ALT: 24 U/L (ref 0–44)
AST: 25 U/L (ref 15–41)
Albumin: 4.9 g/dL (ref 3.5–5.0)
Alkaline Phosphatase: 54 U/L (ref 38–126)
Anion gap: 9 (ref 5–15)
BUN: 14 mg/dL (ref 6–20)
CO2: 25 mmol/L (ref 22–32)
Calcium: 8.9 mg/dL (ref 8.9–10.3)
Chloride: 106 mmol/L (ref 98–111)
Creatinine, Ser: 0.66 mg/dL (ref 0.61–1.24)
GFR calc Af Amer: >60 mL/min (ref >60)
GFR calc non Af Amer: >60 mL/min (ref >60)
Glucose, Bld: 115 mg/dL — ABNORMAL HIGH (ref 70–99)
Potassium: 3.6 mmol/L (ref 3.5–5.1)
Sodium: 140 mmol/L (ref 135–145)
Total Bilirubin: 0.5 mg/dL (ref 0.3–1.2)
Total Protein: 8.4 g/dL — ABNORMAL HIGH (ref 6.5–8.1)

## 2019-08-09 LAB — SALICYLATE LEVEL: Salicylate Lvl: <7 mg/dL (ref 2.8–30.0)

## 2019-08-09 LAB — ETHANOL: Alcohol, Ethyl (B): 243 mg/dL — ABNORMAL HIGH (ref <10)

## 2019-08-09 LAB — URINE DRUG SCREEN, QUALITATIVE (ARMC ONLY)
Amphetamines, Ur Screen: NOT DETECTED
Barbiturates, Ur Screen: NOT DETECTED
Benzodiazepine, Ur Scrn: NOT DETECTED
Cannabinoid 50 Ng, Ur ~~LOC~~: NOT DETECTED
Cocaine Metabolite,Ur ~~LOC~~: NOT DETECTED
MDMA (Ecstasy)Ur Screen: NOT DETECTED
Methadone Scn, Ur: NOT DETECTED
Opiate, Ur Screen: NOT DETECTED
Phencyclidine (PCP) Ur S: NOT DETECTED
Tricyclic, Ur Screen: NOT DETECTED

## 2019-08-09 LAB — CBC
HCT: 37.7 % — ABNORMAL LOW (ref 39.0–52.0)
Hemoglobin: 12.6 g/dL — ABNORMAL LOW (ref 13.0–17.0)
MCH: 25.9 pg — ABNORMAL LOW (ref 26.0–34.0)
MCHC: 33.4 g/dL (ref 30.0–36.0)
MCV: 77.4 fL — ABNORMAL LOW (ref 80.0–100.0)
Platelets: 254 10*3/uL (ref 150–400)
RBC: 4.87 MIL/uL (ref 4.22–5.81)
RDW: 13.5 % (ref 11.5–15.5)
WBC: 8.1 10*3/uL (ref 4.0–10.5)
nRBC: 0 % (ref 0.0–0.2)

## 2019-08-09 LAB — ACETAMINOPHEN LEVEL: Acetaminophen (Tylenol), Serum: <10 ug/mL — ABNORMAL LOW (ref 10–30)

## 2019-08-09 LAB — SARS CORONAVIRUS 2 BY RT PCR (HOSPITAL ORDER, PERFORMED IN ~~LOC~~ HOSPITAL LAB): SARS Coronavirus 2: NEGATIVE

## 2019-08-09 MED ORDER — SODIUM CHLORIDE 0.9 % IV BOLUS
1000.0000 mL | Freq: Once | INTRAVENOUS | Status: AC
  Administered 2019-08-09: 1000 mL via INTRAVENOUS

## 2019-08-09 MED ORDER — DROPERIDOL 2.5 MG/ML IJ SOLN
5.0000 mg | Freq: Once | INTRAMUSCULAR | Status: AC
  Administered 2019-08-09: 5 mg via INTRAMUSCULAR
  Filled 2019-08-09: qty 2

## 2019-08-09 MED ORDER — ALUM & MAG HYDROXIDE-SIMETH 200-200-20 MG/5ML PO SUSP: 30.0000 mL | ORAL | Status: DC | PRN

## 2019-08-09 MED ORDER — MAGNESIUM HYDROXIDE 400 MG/5ML PO SUSP: 30.0000 mL | Freq: Every day | ORAL | Status: DC | PRN

## 2019-08-09 MED ORDER — ACETAMINOPHEN 325 MG PO TABS: 650.0000 mg | ORAL_TABLET | Freq: Four times a day (QID) | ORAL | Status: DC | PRN

## 2019-08-09 NOTE — ED Notes (Signed)
Fiance calls to check on patient, patient able to wake to take call and speaking coherently.

## 2019-08-09 NOTE — ED Notes (Signed)
Pt dressed out into paper scrubs, burgandy top and blue pants with yellow non skid socks in place by this Rn, gary, rn and melody. The following items placed in one of one labeled belonging bag and placed at nurse's station: black sneakers, white socks, jeans, brown and white t shirt, red wallet with assorted business cards and id, neon orange bag with assorted pieces of paper and lighter, blue cellphone, white cellphone charger cord, white employee id, car keys.

## 2019-08-09 NOTE — ED Notes (Signed)
Pt sleeping. resps unlabored.  

## 2019-08-09 NOTE — ED Notes (Signed)
MD notified of hypotension. Order for NS bolus received.

## 2019-08-09 NOTE — ED Notes (Signed)
Patient seen by psychiatry. Patient seems to drowsy to participate in interview. Will re attempt when more awake.

## 2019-08-09 NOTE — ED Notes (Signed)

## 2019-08-09 NOTE — ED Notes (Signed)
Patient resting with eyes closed. Awakes to voice. When questioned if he is OK states yes and returns to resting with eyes closed. Turns self in bed. resp unlabored.

## 2019-08-09 NOTE — ED Notes (Signed)
Pt. Transferred from Triage to room 24 in street clothes with [employer]. Patient in forensic restraints (hand cuffs). Patient is alert with some slurred speech and asking for the handcuffs be removed, and wanting to "talk to my people". He is warm and dry in no acute distress. Patient somewhat uncooperative when redirection attempted related to his being loud and uncooperative.Patient denies SI, HI and AVH. Patient denies SI, HI, and AVH.

## 2019-08-09 NOTE — ED Notes (Signed)
Assumed care of patient "reports to writer her because he is SI. When asked if he had a plan reports he will cut his wrist".Patient states hx of schizophrenia but does not take med for it.  Denies HV/HI. Patient currently calm and cooperative. Safety maintained. Will monitor.

## 2019-08-09 NOTE — Plan of Care (Signed)
Patient newly admitted, hasn't had time to progress.   Problem: Education: Goal: Knowledge of [facility] Education information/materials will improve Outcome: Not Progressing Goal: Emotional status will improve Outcome: Not Progressing Goal: Mental status will improve Outcome: Not Progressing Goal: Verbalization of understanding the information provided will improve Outcome: Not Progressing   Problem: Safety: Goal: Periods of time without injury will increase Outcome: Not Progressing   Problem: Education: Goal: Ability to make informed decisions regarding treatment will improve Outcome: Not Progressing   Problem: Medication: Goal: Compliance with prescribed medication regimen will improve Outcome: Not Progressing   

## 2019-08-09 NOTE — ED Notes (Signed)
Has taken ginger ale without emesis. Await psych eval.

## 2019-08-09 NOTE — ED Notes (Addendum)
Report given to Jeannette, RN 

## 2019-08-09 NOTE — ED Notes (Signed)
Patient in day room playing cards.

## 2019-08-09 NOTE — ED Notes (Signed)
IVC  PT  GOING  TO  BEH MED  TONIGHT ?

## 2019-08-09 NOTE — ED Notes (Signed)
BEHAVIORAL HEALTH ROUNDING Patient sleeping: Yes.   Patient alert and oriented: not applicable Behavior appropriate: Yes.  ; If no, describe:  Nutrition and fluids offered: No Toileting and hygiene offered: No Sitter present: not applicable Law enforcement present: Yes  

## 2019-08-09 NOTE — ED Notes (Signed)
Report to kim, rn.  

## 2019-08-09 NOTE — ED Notes (Signed)
Patient resting quietly in room. No noted distress or abnormal behaviors noted. Will continue 15 minute checks and observation by security camera for safety. 
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
## 2019-08-09 NOTE — ED Notes (Signed)
Pt brought to ED by [employer] officer Rosana Hoes; coming from his home voluntarily; pt is intoxicated; slurred speech; says "I might kill myself"; pt assisted to wheelchair for safety

## 2019-08-09 NOTE — BH Assessment (Signed)
Patient is to be admitted to [facility] BMU by Cristofano.  Attending Physician will be Dr. Weber Cooks.   Patient has been assigned to room 307, by [facility] Charge Nurse Demetria.   Intake Paper Work has been signed and placed on patient chart.   ER staff is aware of the admission:  Lattie Haw, ER Secretary    Dr. Quentin Cornwall, ER MD   Tomasa Hosteller, Patient's Nurse   Butch Penny, Patient Access.

## 2019-08-09 NOTE — ED Notes (Signed)
Patient ate 100% of his dinner, visit with girlfriend was no eventful. Patient sitting in day room with socializing with other patients on the unit.
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
## 2019-08-09 NOTE — ED Notes (Signed)
Pt stated he would allow his girlfriend to visit. RN gave patient's girlfriend the visitation times and made clear it was only 1, 15 minute visit.

## 2019-08-09 NOTE — ED Notes (Signed)
BEHAVIORAL HEALTH ROUNDING  Patient sleeping: yes Patient alert and oriented: yes  Behavior appropriate: Yes. ; If no, describe:  Nutrition and fluids offered: Yes  Toileting and hygiene offered: Yes  Sitter present: not applicable  Law enforcement present: Yes   

## 2019-08-09 NOTE — ED Notes (Signed)
girlfriend here to visit.

## 2019-08-09 NOTE — ED Notes (Signed)
Pt on q15 min safety checks

## 2019-08-09 NOTE — ED Triage Notes (Signed)
Pt arrives with [employer] with aggressive behavior. Pt's wife called PD and pt has made statements in this nurses hearing of SI.

## 2019-08-10 DIAGNOSIS — F1020 Alcohol dependence, uncomplicated: Principal | ICD-10-CM

## 2019-08-10 LAB — LIPID PANEL
Cholesterol: 217 mg/dL — ABNORMAL HIGH (ref 0–200)
HDL: 44 mg/dL (ref >40)
LDL Cholesterol: 126 mg/dL — ABNORMAL HIGH (ref 0–99)
Total CHOL/HDL Ratio: 4.9 RATIO
Triglycerides: 234 mg/dL — ABNORMAL HIGH (ref <150)
VLDL: 47 mg/dL — ABNORMAL HIGH (ref 0–40)

## 2019-08-10 LAB — HEPATIC FUNCTION PANEL
ALT: 26 U/L (ref 0–44)
AST: 27 U/L (ref 15–41)
Albumin: 4.4 g/dL (ref 3.5–5.0)
Alkaline Phosphatase: 53 U/L (ref 38–126)
Bilirubin, Direct: <0.1 mg/dL (ref 0.0–0.2)
Total Bilirubin: 0.6 mg/dL (ref 0.3–1.2)
Total Protein: 7.6 g/dL (ref 6.5–8.1)

## 2019-08-10 LAB — TSH: TSH: 0.59 u[IU]/mL (ref 0.350–4.500)

## 2019-08-10 MED ORDER — CITALOPRAM HYDROBROMIDE 20 MG PO TABS
10.0000 mg | ORAL_TABLET | Freq: Every day | ORAL | Status: DC
  Administered 2019-08-10 – 2019-08-11 (×2): 10 mg via ORAL
  Filled 2019-08-10 (×2): qty 1
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
## 2019-08-10 NOTE — Progress Notes (Signed)
D: Substance Abuse / Acts of Violence   A: Patient stated slept good last night .Stated appetite  good and energy level   normal. Stated concentration  good . Stated on Depression scale 0 , hopeless  0 and anxiety 10 ( low 0-10 high) Denies suicidal  homicidal ideations . No auditory hallucinations  No pain concerns . Appropriate ADL'S. Interacting with peers and staff.   Encourage patient participation with unit programming . Instruction  Given on  Medication , verbalize understanding.Patient   voice  ,understanding of information received , Voice of no safety concerns . Able to get adequate  rest and attending unit  programing . No anger  or hostility  noted this shift  Knowledgeable of [place] information . Attending unit programing . Interacting  with peers on the unit . Emotional and mental status  improving voice no concerns around sleep . No anger management  noted,  able to maintained behavior  . No safety concerns  Patient working on coping , decision making and anxiety.Denies suicidal  plan  Compliant  with medication   R: Voice no other concerns. Staff continue to monitor

## 2019-08-10 NOTE — Plan of Care (Signed)
Patient states he is feeling better this evening and denies SI/HI/AVH  Problem: Education: Goal: Emotional status will improve Outcome: Progressing Goal: Mental status will improve Outcome: Progressing

## 2019-08-10 NOTE — Progress Notes (Addendum)
Admission Note:  33 yr male who presents involuntary commitment in no acute distress for the treatment of SI and Depression. Patient is alert and oriented x 4, affect is blunted, he appears excited and restless, thoughts are disorganized , speech is tangential. Patient was reoriented  and he was calm and cooperative with admission process. Patient denies SI/HI/AVH.  Patient explained he had altercation with his wife, he was upset, drinking heavily and the police showed up. Per report patient's blood alcohol level was 243, he was aggressive and was an element risk.  Patient has Past medical Hx of Seizures and Bipolar.patient's skin was assessed in presence of Rodman Key RN and found to be clear, warm and dry. Patient was also searched for contraband non found, POC and unit policies explained and understanding verbalized, also consents obtained. Patient was oriented to the unit and given toiletries. No distress noted , 15 minutes safety checks maintained will continue to monitor.

## 2019-08-10 NOTE — Plan of Care (Signed)
Patient   voice  ,understanding of information received , Voice of no safety concerns . Able to get adequate  rest and attending unit  programing . No anger  or hostility  noted this shift  Knowledgeable of [place] information . Attending unit programing . Interacting  with peers on the unit . Emotional and mental status  improving voice no concerns around sleep . No anger management  noted,  able to maintained behavior  . No safety concerns  Patient working on coping , decision making and anxiety.Denies suicidal  plan  Compliant  with medication .   Problem: Education: Goal: Knowledge of  General Education information/materials will improve Outcome: Progressing Goal: Emotional status will improve Outcome: Progressing Goal: Mental status will improve Outcome: Progressing Goal: Verbalization of understanding the information provided will improve Outcome: Progressing   Problem: Safety: Goal: Periods of time without injury will increase Outcome: Progressing   Problem: Education: Goal: Ability to make informed decisions regarding treatment will improve Outcome: Progressing   Problem: Medication: Goal: Compliance with prescribed medication regimen will improve Outcome: Progressing

## 2019-08-10 NOTE — BHH Counselor (Signed)
CSW discussed aftercare with the patient including outpatient or inpatient referral.  Patient declined referral at this time stating that he needed to talk to his girlfriend first.  Assunta Curtis, MSW, LCSW 08/10/2019 11:52 AM

## 2019-08-10 NOTE — Tx Team (Addendum)
Interdisciplinary Treatment and Diagnostic Plan Update  08/10/2019 Time of Session: 1030am [name] MRN: [id number]  Principal Diagnosis: <principal problem not specified>  Secondary Diagnoses: Active Problems:   Bipolar 1 disorder (HCC)   Current Medications:  Current Facility-Administered Medications  Medication Dose Route Frequency Provider Last Rate Last Dose  . acetaminophen (TYLENOL) tablet 650 mg  650 mg Oral Q6H PRN Cristofano, Dorene Ar, MD      . alum & mag hydroxide-simeth (MAALOX/MYLANTA) 200-200-20 MG/5ML suspension 30 mL  30 mL Oral Q4H PRN Cristofano, Paul A, MD      . magnesium hydroxide (MILK OF MAGNESIA) suspension 30 mL  30 mL Oral Daily PRN Cristofano, Dorene Ar, MD       PTA Medications: Medications Prior to Admission  Medication Sig Dispense Refill Last Dose  . omeprazole (PRILOSEC) 20 MG capsule Take 1 capsule (20 mg total) by mouth daily. 30 capsule 0   . ondansetron (ZOFRAN) 4 MG tablet Take 1 tablet (4 mg total) by mouth daily as needed for nausea or vomiting. (Patient not taking: Reported on 08/09/2019) 15 tablet 0     Patient Stressors: Financial difficulties Marital or family conflict Substance abuse  Patient Strengths: Agricultural engineer for treatment/growth Supportive family/friends  Treatment Modalities: Medication Management, Group therapy, Case management,  1 to 1 session with clinician, Psychoeducation, Recreational therapy.   Physician Treatment Plan for Primary Diagnosis: <principal problem not specified> Long Term Goal(s):     Short Term Goals:    Medication Management: Evaluate patient's response, side effects, and tolerance of medication regimen.  Therapeutic Interventions: 1 to 1 sessions, Unit Group sessions and Medication administration.  Evaluation of Outcomes: Not Met  Physician Treatment Plan for Secondary Diagnosis: Active Problems:   Bipolar 1 disorder ([place])  Long Term Goal(s):     Short Term Goals:        Medication Management: Evaluate patient's response, side effects, and tolerance of medication regimen.  Therapeutic Interventions: 1 to 1 sessions, Unit Group sessions and Medication administration.  Evaluation of Outcomes: Not Met   RN Treatment Plan for Primary Diagnosis: <principal problem not specified> Long Term Goal(s): Knowledge of disease and therapeutic regimen to maintain health will improve  Short Term Goals: Ability to demonstrate self-control, Ability to participate in decision making will improve, Ability to verbalize feelings will improve, Ability to disclose and discuss suicidal ideas, Ability to identify and develop effective coping behaviors will improve and Compliance with prescribed medications will improve  Medication Management: RN will administer medications as ordered by provider, will assess and evaluate patient's response and provide education to patient for prescribed medication. RN will report any adverse and/or side effects to prescribing provider.  Therapeutic Interventions: 1 on 1 counseling sessions, Psychoeducation, Medication administration, Evaluate responses to treatment, Monitor vital signs and CBGs as ordered, Perform/monitor CIWA, COWS, AIMS and Fall Risk screenings as ordered, Perform wound care treatments as ordered.  Evaluation of Outcomes: Not Met   LCSW Treatment Plan for Primary Diagnosis: <principal problem not specified> Long Term Goal(s): Safe transition to appropriate next level of care at discharge, Engage patient in therapeutic group addressing interpersonal concerns.  Short Term Goals: Engage patient in aftercare planning with referrals and resources  Therapeutic Interventions: Assess for all discharge needs, 1 to 1 time with Social worker, Explore available resources and support systems, Assess for adequacy in community support network, Educate family and significant other(s) on suicide prevention, Complete Psychosocial Assessment,  Interpersonal group therapy.  Evaluation of Outcomes: Not Met  Progress in Treatment: Attending groups: Yes. Participating in groups: Yes. Taking medication as prescribed: Yes. Toleration medication: Yes. Family/Significant other contact made: No, will contact:  when pt gives consent Patient understands diagnosis: Yes. Discussing patient identified problems/goals with staff: Yes. Medical problems stabilized or resolved: No. Denies suicidal/homicidal ideation: Yes. Issues/concerns per patient self-inventory: No. Other: NA  New problem(s) identified: No, Describe:  none reported  New Short Term/Long Term Goal(s):Attend outpatient treatment, take medication as prescribed, develop and implement healthy coping methods  Patient Goals:  "I really dont know right know"  Discharge Plan or Barriers: Plan is TBD, pt is undecided at this time  Reason for Continuation of Hospitalization: Medication stabilization  Estimated Length of Stay:1-7 days  Recreational Therapy: Patient Stressors: N/A  Patient Goal: Patient will identify 3 resources in their community to aid in recovery within 5 recreation therapy group sessions  Attendees: Patient:Javier Glover 08/10/2019 11:30 AM  Physician: Alethia Berthold 08/10/2019 11:30 AM  Nursing: Polly Cobia 08/10/2019 11:30 AM  RN Care Manager: 08/10/2019 11:30 AM  Social Worker: Sanjuana Kava [place] 08/10/2019 11:30 AM  Recreational Therapist: Isaias Sakai Sekai Nayak 08/10/2019 11:30 AM  Other:  08/10/2019 11:30 AM  Other:  08/10/2019 11:30 AM  Other: 08/10/2019 11:30 AM    Scribe for Treatment Team: Yvette Rack, LCSW 08/10/2019 11:30 AM

## 2019-08-10 NOTE — Progress Notes (Signed)
D - Patient was in the day room upon arrival to the unit. Patient was pleasant during assessment denying SI/HI/AVH. Patient endorses anxiety and depression stating, "I have been talking to my wife, I think she will take me back. Family issues cause me anxiety."   A - Patient didn't have any scheduled medication this evening. Patient given education. Patient given support and encouragement to be active in his treatment plan. Patient informed to let staff know if there are any issues or problems on the unit.   R - patient bing monitored Q 15 minutes for safety per unit protocol. Patient remains safe on the unit.

## 2019-08-10 NOTE — Progress Notes (Signed)
Recreation Therapy Notes   Date:08/10/2019  Time: 9:30 am  Location: Craft room  Behavioral response: Appropriate   Intervention Topic: Coping Skills  Discussion/Intervention:  Group content on today was focused on coping skills. The group defined what coping skills are and when they can be used. Individuals described how they normally cope with thing and the coping skills they normally use. Patients expressed why it is important to cope with things and how not coping with things can affect you. The group participated in the intervention "My coping box" and made coping boxes while adding coping skills they could use in the future to the box. Clinical Observations/Feedback:  Patient came to group and was focused on what peers and staff had to say about coping-skills. Individual was social with peers and staff while participating in group.  Sherrita Riederer LRT/CTRS         Loreena Valeri 08/10/2019 11:40 AM

## 2019-08-10 NOTE — BHH Suicide Risk Assessment (Signed)
[facility] Admission Suicide Risk Assessment   Nursing information obtained from:  Patient Demographic factors:  Male Current Mental Status:  Self-harm thoughts Loss Factors:  Loss of significant relationship Historical Factors:  NA Risk Reduction Factors:  NA  Total Time spent with patient: 30 minutes Principal Problem: Alcohol use disorder, severe, dependence ([place]) Diagnosis:  Principal Problem:   Alcohol use disorder, severe, dependence ([place]) Active Problems:   Intellectual disability  Subjective Data: Patient seen and chart reviewed.  Patient also met with treatment team.  Patient is a gentleman with a long history of substance abuse problems and chronic intellectual disability who was brought to the emergency room with reports that he had been aggressive and hostile towards his wife.  On interview today the patient is sober.  He admits that he relapsed into drinking.  He claims that he had been sober for a couple months prior to that.  Admits getting into a fight with his wife.  Denies homicidal or suicidal ideation.  Patient says his wife has thrown him out and he is not able to go back home right now.  Denies any hallucinations visual or auditory denies any psychotic symptoms.  He does tell me that he has a chronic anger problem.  Continued Clinical Symptoms:  Alcohol Use Disorder Identification Test Final Score (AUDIT): 5 The "Alcohol Use Disorders Identification Test", Guidelines for Use in Primary Care, Second Edition.  World Pharmacologist [facility]). Score between 0-7:  no or low risk or alcohol related problems. Score between 8-15:  moderate risk of alcohol related problems. Score between 16-19:  high risk of alcohol related problems. Score 20 or above:  warrants further diagnostic evaluation for alcohol dependence and treatment.   CLINICAL FACTORS:   Alcohol/Substance Abuse/Dependencies   Musculoskeletal: Strength & Muscle Tone: within normal limits Gait & Station:  normal Patient leans: N/A  Psychiatric Specialty Exam: Physical Exam  Nursing note and vitals reviewed. Constitutional: He appears well-developed and well-nourished.  HENT:  Head: Normocephalic and atraumatic.  Eyes: Pupils are equal, round, and reactive to light. Conjunctivae are normal.  Neck: Normal range of motion.  Cardiovascular: Regular rhythm and normal heart sounds.  Respiratory: Effort normal. No respiratory distress.  GI: Soft.  Musculoskeletal: Normal range of motion.  Neurological: He is alert.  Skin: Skin is warm and dry.  Psychiatric: He has a normal mood and affect. His speech is normal and behavior is normal. Thought content normal. Cognition and memory are impaired. He expresses impulsivity.    Review of Systems  Constitutional: Negative.   HENT: Negative.   Eyes: Negative.   Respiratory: Negative.   Cardiovascular: Negative.   Gastrointestinal: Negative.   Musculoskeletal: Negative.   Skin: Negative.   Neurological: Negative.   Psychiatric/Behavioral: Positive for substance abuse. Negative for depression, hallucinations, memory loss and suicidal ideas. The patient is not nervous/anxious and does not have insomnia.     Blood pressure (!) 132/52, pulse 69, temperature 98.1 F (36.7 C), temperature source Oral, resp. rate 15, height 6' (1.829 m), weight 81.6 kg, SpO2 99 %.Body mass index is 24.4 kg/m.  General Appearance: Casual  Eye Contact:  Good  Speech:  Slow  Volume:  Decreased  Mood:  Euthymic  Affect:  Congruent  Thought Process:  Goal Directed  Orientation:  Full (Time, Place, and Person)  Thought Content:  Logical  Suicidal Thoughts:  No  Homicidal Thoughts:  No  Memory:  Immediate;   Fair Recent;   Fair Remote;   Fair  Judgement:  Fair  Insight:  Fair  Psychomotor Activity:  Normal  Concentration:  Concentration: Fair  Recall:  [employer] of Knowledge:  Fair  Language:  Fair  Akathisia:  No  Handed:  Right  AIMS (if indicated):      Assets:  Desire for Improvement Housing Physical Health Resilience  ADL's:  Intact  Cognition:  Impaired,  Mild  Sleep:  Number of Hours: 7      COGNITIVE FEATURES THAT CONTRIBUTE TO RISK:  Thought constriction (tunnel vision)    SUICIDE RISK:   Minimal: No identifiable suicidal ideation.  Patients presenting with no risk factors but with morbid ruminations; may be classified as minimal risk based on the severity of the depressive symptoms  PLAN OF CARE: Patient does not appear to need alcohol withdrawal treatment.  He tells me that he needs to "get back on medicine".  Looking back through his old chart I could not find any evidence that medicine had been particularly helpful for him in the past.  I asked him to be more clear about what has been helpful for him and he told me Ritalin.  He does tell me however that he has chronic irritability.  I suggested perhaps a low dose of citalopram.  Otherwise patient just needs assessment and referral on to appropriate substance abuse treatment whether inpatient or outpatient.  15-minute checks are adequate.  I certify that inpatient services furnished can reasonably be expected to improve the patient's condition.   Alethia Berthold, MD 08/10/2019, 3:44 PM

## 2019-08-10 NOTE — Progress Notes (Signed)
Recreation Therapy Notes  INPATIENT RECREATION THERAPY ASSESSMENT  Patient Details Name: Javier Glover MRN: [id number] DOB: 27-Aug-1986 Today's Date: 08/10/2019       Information Obtained From: Patient  Able to Participate in Assessment/Interview: Yes  Patient Presentation: Responsive  Reason for Admission (Per Patient): Active Symptoms, Suicidal Ideation  Patient Stressors: Relationship  Coping Skills:   Exercise  Leisure Interests (2+):  Lawyer, Exercise - Walking(Dirt Bikes)  Frequency of Recreation/Participation: Monthly  Awareness of Community Resources:     [employer]:     Current Use:    If no, Barriers?:    Expressed Interest in [place] of Residence:  Insurance underwriter  Patient Main Form of Transportation: Walk  Patient Strengths:  talking  Patient Identified Areas of Improvement:  N/A  Patient Goal for Hospitalization:  To get into a treatment program  Current SI (including self-harm):  No  Current HI:  No  Current AVH: No  Staff Intervention Plan: Collaborate with Interdisciplinary Treatment Team  Consent to Intern Participation: N/A  Javier Glover 08/10/2019, 2:15 PM

## 2019-08-10 NOTE — BHH Group Notes (Signed)
LCSW Group Therapy Note  08/10/2019 1:00 PM  Type of Therapy/Topic:  Group Therapy:  Feelings about Diagnosis  Participation Level:  None   Description of Group:   This group will allow patients to explore their thoughts and feelings about diagnoses they have received. Patients will be guided to explore their level of understanding and acceptance of these diagnoses. Facilitator will encourage patients to process their thoughts and feelings about the reactions of others to their diagnosis and will guide patients in identifying ways to discuss their diagnosis with significant others in their lives. This group will be process-oriented, with patients participating in exploration of their own experiences, giving and receiving support, and processing challenge from other group members.   Therapeutic Goals: 1. Patient will demonstrate understanding of diagnosis as evidenced by identifying two or more symptoms of the disorder 2. Patient will be able to express two feelings regarding the diagnosis 3. Patient will demonstrate their ability to communicate their needs through discussion and/or role play  Summary of Patient Progress: Patient was present in group, however patient slept throughout.  Therapeutic Modalities:   Cognitive Behavioral Therapy Brief Therapy Feelings Identification   Assunta Curtis, MSW, LCSW 08/10/2019 12:32 PM

## 2019-08-10 NOTE — Tx Team (Signed)
Initial Treatment Plan 08/10/2019 2:40 AM [facility] VZD:[id number]    PATIENT STRESSORS: Financial difficulties Marital or family conflict Substance abuse   PATIENT STRENGTHS: Agricultural engineer for treatment/growth Supportive family/friends   PATIENT IDENTIFIED PROBLEMS: Substance Abuse    Suicidal ideation                  DISCHARGE CRITERIA:  Improved stabilization in mood, thinking, and/or behavior Motivation to continue treatment in a less acute level of care  PRELIMINARY DISCHARGE PLAN: Outpatient therapy Participate in family therapy  PATIENT/FAMILY INVOLVEMENT: This treatment plan has been presented to and reviewed with the patient, Javier Glover, The patient and family have been given the opportunity to ask questions and make suggestions.  Harl Bowie, RN 08/10/2019, 2:40 AM

## 2019-08-11 MED ORDER — CITALOPRAM HYDROBROMIDE 10 MG PO TABS: 10.0000 mg | ORAL_TABLET | Freq: Every day | ORAL | 1 refills | Status: DC

## 2019-08-11 NOTE — Discharge Summary (Signed)
Physician Discharge Summary Note  Patient:  Javier Glover is an 33 y.o., male MRN:  [id number] DOB:  [birth year] Patient phone:  [phone] (home)  Patient address:   [address] [place] [address],  Total Time spent with patient: 30 minutes  Date of Admission:  08/09/2019 Date of Discharge: August 11, 2019  Reason for Admission: Patient was admitted due to intoxication and reported suicidal ideation  Principal Problem: Alcohol use disorder, severe, dependence (HCC) Discharge Diagnoses: Principal Problem:   Alcohol use disorder, severe, dependence (HCC) Active Problems:   Intellectual disability   Past Psychiatric History: History of chronic alcohol abuse noncompliance with recommended outpatient treatment  Past Medical History:  Past Medical History:  Diagnosis Date  . Seizures (HCC)     Past Surgical History:  Procedure Laterality Date  . FRACTURE SURGERY     Family History: History reviewed. No pertinent family history. Family Psychiatric  History: See previous Social History:  Social History   Substance and Sexual Activity  Alcohol Use Yes   Comment: occ     Social History   Substance and Sexual Activity  Drug Use Yes  . Types: Cocaine    Social History   Socioeconomic History  . Marital status: Single    Spouse name: Not on file  . Number of children: Not on file  . Years of education: Not on file  . Highest education level: Not on file  Occupational History  . Not on file  Social Needs  . Financial resource strain: Not on file  . Food insecurity    Worry: Not on file    Inability: Not on file  . Transportation needs    Medical: Not on file    Non-medical: Not on file  Tobacco Use  . Smoking status: Current Every Day Smoker    Packs/day: 0.50    Years: 14.00    Pack years: 7.00    Types: Cigarettes  . Smokeless tobacco: Never Used  Substance and Sexual Activity  . Alcohol use: Yes    Comment: occ  . Drug use: Yes    Types:  Cocaine  . Sexual activity: Not on file  Lifestyle  . Physical activity    Days per week: Not on file    Minutes per session: Not on file  . Stress: Not on file  Relationships  . Social Musician on phone: Not on file    Gets together: Not on file    Attends religious service: Not on file    Active member of club or organization: Not on file    Attends meetings of clubs or organizations: Not on file    Relationship status: Not on file  Other Topics Concern  . Not on file  Social History Narrative  . Not on file    Hospital Course: Patient admitted to the psychiatric ward.  15-minute checks for safety.  Did not engage in any dangerous suicidal or aggressive behavior.  Cooperative with treatment.  No sign of significant alcohol withdrawal symptoms.  Did not need medical treatment for detox.  Engaged in individual and group assessment in some group therapy.  Patient seems only partially engaged in the idea of substance abuse treatment.  He did request "something" for his "depression" although he acknowledges that antidepressants had not been helpful for him in the past.  I agreed to try low-dose citalopram 10 mg a day patient has tolerated it okay.  He will be given a prescription  at discharge.  He has seen the representative from [facility] and is agreeing that he will follow-up for outpatient treatment for substance abuse and has been educated about the ongoing dangers of alcohol abuse.  Discharged today.  Physical Findings: AIMS:  , ,  ,  ,    CIWA:  CIWA-Ar Total: 0 COWS:     Musculoskeletal: Strength & Muscle Tone: within normal limits Gait & Station: normal Patient leans: N/A  Psychiatric Specialty Exam: Physical Exam  Nursing note and vitals reviewed. Constitutional: He appears well-developed and well-nourished.  HENT:  Head: Normocephalic and atraumatic.  Eyes: Pupils are equal, round, and reactive to light. Conjunctivae are normal.  Neck: Normal range of motion.   Cardiovascular: Regular rhythm and normal heart sounds.  Respiratory: Effort normal.  GI: Soft.  Musculoskeletal: Normal range of motion.  Neurological: He is alert.  Skin: Skin is warm and dry.  Psychiatric: He has a normal mood and affect. His speech is normal and behavior is normal. Judgment and thought content normal. Cognition and memory are normal.    Review of Systems  Constitutional: Negative.   HENT: Negative.   Eyes: Negative.   Respiratory: Negative.   Cardiovascular: Negative.   Gastrointestinal: Negative.   Musculoskeletal: Negative.   Skin: Negative.   Neurological: Negative.   Psychiatric/Behavioral: Negative.     Blood pressure 117/80, pulse 63, temperature 97.8 F (36.6 C), temperature source Oral, resp. rate 16, height 6' (1.829 m), weight 81.6 kg, SpO2 100 %.Body mass index is 24.4 kg/m.  General Appearance: Casual  Eye Contact:  Fair  Speech:  Normal Rate  Volume:  Normal  Mood:  Euthymic  Affect:  Congruent  Thought Process:  Goal Directed  Orientation:  Full (Time, Place, and Person)  Thought Content:  Logical  Suicidal Thoughts:  No  Homicidal Thoughts:  No  Memory:  Immediate;   Fair Recent;   Fair Remote;   Fair  Judgement:  Fair  Insight:  Fair  Psychomotor Activity:  Normal  Concentration:  Concentration: Fair  Recall:  [place] of Knowledge:  Fair  Language:  Fair  Akathisia:  No  Handed:  Right  AIMS (if indicated):     Assets:  Desire for Improvement Housing Physical Health  ADL's:  Intact  Cognition:  Impaired,  Mild  Sleep:  Number of Hours: 6     Have you used any form of tobacco in the last 30 days? (Cigarettes, Smokeless Tobacco, Cigars, and/or Pipes): No  Has this patient used any form of tobacco in the last 30 days? (Cigarettes, Smokeless Tobacco, Cigars, and/or Pipes) Yes, No  Blood Alcohol level:  Lab Results  Component Value Date   ETH 243 (H) 08/09/2019   ETH 176 (H) 01/75/1025    Metabolic Disorder Labs:   No results found for: HGBA1C, MPG No results found for: PROLACTIN Lab Results  Component Value Date   CHOL 217 (H) 08/10/2019   TRIG 234 (H) 08/10/2019   HDL 44 08/10/2019   CHOLHDL 4.9 08/10/2019   VLDL 47 (H) 08/10/2019   LDLCALC 126 (H) 08/10/2019    See Psychiatric Specialty Exam and Suicide Risk Assessment completed by Attending Physician prior to discharge.  Discharge destination:  Home  Is patient on multiple antipsychotic therapies at discharge:  No   Has Patient had three or more failed trials of antipsychotic monotherapy by history:  No  Recommended Plan for Multiple Antipsychotic Therapies: NA  Discharge Instructions    Diet - low sodium  heart healthy   Complete by: As directed    Increase activity slowly   Complete by: As directed      Allergies as of 08/11/2019      Reactions   Ibuprofen Rash      Medication List    STOP taking these medications   omeprazole 20 MG capsule Commonly known as: PriLOSEC   ondansetron 4 MG tablet Commonly known as: Zofran     TAKE these medications     Indication  citalopram 10 MG tablet Commonly known as: CELEXA Take 1 tablet (10 mg total) by mouth daily. Start taking on: August 12, 2019  Indication: Depression, Generalized Anxiety Disorder        Follow-up recommendations:  Activity:  Activity as tolerated Diet:  Regular diet Other:  Follow-up with [facility]  Comments: Prescriptions given at discharge for the citalopram.  On further reflection I think that the patient does have some behaviors and symptoms that could be indicative of ADHD or similar processes.  He has some cognitive impairment although the degree is undefined and is certainly in the minor range.  Nevertheless he does seem a little impulsive and hyperactive and an ADHD sort of way.  This is something that could be considered after discharge.  He is probably not a good candidate for starting stimulants while he is still actively drinking and not engaged  in treatment but I told him that he should keep following up with [facility] to discuss this as well as other issues.  Signed: Mordecai RasmussenJohn Manson [place], MD 08/11/2019, 9:24 AM

## 2019-08-11 NOTE — Plan of Care (Signed)
  Problem: Substance Use/Abuse Goal: STG - Patient will identify 3 resources in their community to aid in recovery within 5 recreation therapy group sessions Description: STG - Patient will identify 3 resources in their community to aid in recovery within 5 recreation therapy group sessions 08/11/2019 1120 by Ernest Haber, LRT Outcome: Adequate for Discharge 08/11/2019 1119 by Ernest Haber, LRT Outcome: Adequate for Discharge

## 2019-08-11 NOTE — BHH Suicide Risk Assessment (Signed)
[place] INPATIENT:  Family/Significant Other Suicide Prevention Education  Suicide Prevention Education:  Contact Attempts: Delphina Cahill, girlfriend, [phone] has been identified by the patient as the family member/significant other with whom the patient will be residing, and identified as the person(s) who will aid the patient in the event of a mental health crisis.  With written consent from the patient, two attempts were made to provide suicide prevention education, prior to and/or following the patient's discharge.  We were unsuccessful in providing suicide prevention education.  A suicide education pamphlet was given to the patient to share with family/significant other.  Date and time of first attempt: 08/11/2019 at 9:49AM Date and time of second attempt: Second attempt is needed  Rozann Lesches 08/11/2019, 9:46 AM

## 2019-08-11 NOTE — Progress Notes (Signed)
Recreation Therapy Notes  INPATIENT RECREATION TR PLAN  Patient Details Name: Javier Glover MRN: [id number] DOB: 04-30-1986 Today's Date: 08/11/2019  Rec Therapy Plan Is patient appropriate for Therapeutic Recreation?: Yes Treatment times per week: at least 3 Estimated Length of Stay: 5-7 days TR Treatment/Interventions: Group participation (Comment)  Discharge Criteria Pt will be discharged from therapy if:: Discharged Treatment plan/goals/alternatives discussed and agreed upon by:: Patient/family  Discharge Summary Short term goals set: Patient will identify 3 resources in their community to aid in recovery within 5 recreation therapy group sessions Short term goals met: Adequate for discharge Progress toward goals comments: Groups attended Which groups?: Coping skills, Other (Comment)(Necessities) Reason goals not met: N/A Therapeutic equipment acquired: N/A Reason patient discharged from therapy: Discharge from hospital Pt/family agrees with progress & goals achieved: Yes Date patient discharged from therapy: 08/11/19   Danisa Kopec 08/11/2019, 11:21 AM

## 2019-08-11 NOTE — Progress Notes (Signed)
  [facility] Adult Case Management Discharge Plan :  Will you be returning to the same living situation after discharge:  Yes,  pt is returning home. At discharge, do you have transportation home?: Yes,  pt reports girlfriend will pick up Do you have the ability to pay for your medications: Yes,  [place] [employer]  Release of information consent forms completed and in the chart;  Patient's signature needed at discharge.  Patient to Follow up at: Follow-up Information    [facility] Follow up on 08/13/2019.   Why: Appointment is 08/13/19 at [facility] with Lanae Boast. Contact information: [place] [address] [phone]           Next level of care provider has access to [place] and Suicide Prevention discussed: Yes,  SPE attempts made.  Have you used any form of tobacco in the last 30 days? (Cigarettes, Smokeless Tobacco, Cigars, and/or Pipes): No  Has patient been referred to the Quitline?: Patient refused referral  Patient has been referred for addiction treatment: Pt. refused referral  Rozann Lesches, LCSW 08/11/2019, 9:42 AM

## 2019-08-11 NOTE — BHH Suicide Risk Assessment (Signed)
[place] INPATIENT:  Family/Significant Other Suicide Prevention Education  Suicide Prevention Education:  Education Completed; Delphina Cahill, girlfriend, [phone] has been identified by the patient as the family member/significant other with whom the patient will be residing, and identified as the person(s) who will aid the patient in the event of a mental health crisis (suicidal ideations/suicide attempt).  With written consent from the patient, the family member/significant other has been provided the following suicide prevention education, prior to the and/or following the discharge of the patient.  The suicide prevention education provided includes the following:  Suicide risk factors  Suicide prevention and interventions  National Suicide Hotline telephone number  [facility] assessment telephone number  [facility] Emergency Assistance [place] and/or Residential Mobile Crisis Unit telephone number  Request made of family/significant other to:  Remove weapons (e.g., guns, rifles, knives), all items previously/currently identified as safety concern.    Remove drugs/medications (over-the-counter, prescriptions, illicit drugs), all items previously/currently identified as a safety concern.  The family member/significant other verbalizes understanding of the suicide prevention education information provided.  The family member/significant other agrees to remove the items of safety concern listed above.  Rozann Lesches 08/11/2019, 9:59 AM

## 2019-08-11 NOTE — Progress Notes (Signed)
Recreation Therapy Notes  Date:08/11/2019  Time: 9:30 am  Location: Craft room  Behavioral response: Appropriate   Intervention Topic: Necessities  Discussion/Intervention:  Group content on today was focused on necessities. The group defined necessities and how they determine their necessities. Individuals expressed how many necessities they have and if it changes from day to day. Patients described the difference between wants and needs. The group explained how they have overspent on wants in the past. Individuals described a reoccurring necessity for them. The intervention "What I need" helped patients differentiate between wants and needs.  Clinical Observations/Feedback:  Patient came to group late due to unknown reason. Individual was social with peers and staff while participating in group. He left group early to avoid confrontation with another peer. Jeanise Durfey LRT/CTRS         Javier Glover 08/11/2019 11:19 AM

## 2019-08-11 NOTE — BHH Suicide Risk Assessment (Signed)
[facility] Discharge Suicide Risk Assessment   Principal Problem: Alcohol use disorder, severe, dependence ([place]) Discharge Diagnoses: Principal Problem:   Alcohol use disorder, severe, dependence ([place]) Active Problems:   Intellectual disability   Total Time spent with patient: 30 minutes  Musculoskeletal: Strength & Muscle Tone: within normal limits Gait & Station: normal Patient leans: N/A  Psychiatric Specialty Exam: Review of Systems  Constitutional: Negative.   HENT: Negative.   Eyes: Negative.   Respiratory: Negative.   Cardiovascular: Negative.   Gastrointestinal: Negative.   Musculoskeletal: Negative.   Skin: Negative.   Neurological: Negative.   Psychiatric/Behavioral: Negative.     Blood pressure 117/80, pulse 63, temperature 97.8 F (36.6 C), temperature source Oral, resp. rate 16, height 6' (1.829 m), weight 81.6 kg, SpO2 100 %.Body mass index is 24.4 kg/m.  General Appearance: Casual  Eye Contact::  Good  Speech:  Clear and [id number]  Volume:  Normal  Mood:  Euthymic  Affect:  Congruent  Thought Process:  Goal Directed  Orientation:  Full (Time, Place, and Person)  Thought Content:  Logical  Suicidal Thoughts:  No  Homicidal Thoughts:  No  Memory:  Immediate;   Fair Recent;   Fair Remote;   Fair  Judgement:  Fair  Insight:  Fair  Psychomotor Activity:  Normal  Concentration:  Fair  Recall:  [employer] [place]  Language: Fair  Akathisia:  No  Handed:  Right  AIMS (if indicated):     Assets:  Desire for Improvement Housing Social Support  Sleep:  Number of Hours: 6  Cognition: WNL  ADL's:  Intact   Mental Status Per Nursing Assessment::   On Admission:  Self-harm thoughts  Demographic Factors:  Male  Loss Factors: Financial problems/change in socioeconomic status  Historical Factors: Impulsivity  Risk Reduction Factors:   Religious beliefs about death, Positive social support and Positive therapeutic relationship  Continued  Clinical Symptoms:  Alcohol/Substance Abuse/Dependencies  Cognitive Features That Contribute To Risk:  Loss of executive function    Suicide Risk:  Minimal: No identifiable suicidal ideation.  Patients presenting with no risk factors but with morbid ruminations; may be classified as minimal risk based on the severity of the depressive symptoms    Plan Of Care/Follow-up recommendations:  Activity:  Activity as tolerated Diet:  Regular diet Other:  Follow-up for outpatient substance abuse treatment with [facility]  Alethia Berthold, MD 08/11/2019, 9:21 AM
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
## 2019-08-11 NOTE — Progress Notes (Signed)
D: Patient is aware of  Discharge this shift .  A:Patient denies suicidal /homicidal ideations. Patient received all belongings brought in.  No Storage medications. Writer reviewed Discharge Summary, Suicide Risk Assessment, and Transitional Record.  Aware  Of follow up appointment .  R: Patient left unit with no questions  Or concerns  With

## 2019-08-31 ENCOUNTER — Ambulatory Visit (INDEPENDENT_AMBULATORY_CARE_PROVIDER_SITE_OTHER): Payer: Medicaid Other | Admitting: Gastroenterology

## 2019-08-31 ENCOUNTER — Other Ambulatory Visit: Payer: Self-pay

## 2019-08-31 ENCOUNTER — Encounter: Payer: Self-pay | Admitting: Gastroenterology

## 2019-08-31 VITALS — BP 123/71 | HR 81 | Temp 97.6°F | Ht 68.0 in | Wt 174.0 lb

## 2019-08-31 DIAGNOSIS — F1911 Other psychoactive substance abuse, in remission: Secondary | ICD-10-CM

## 2019-08-31 DIAGNOSIS — R112 Nausea with vomiting, unspecified: Secondary | ICD-10-CM

## 2019-08-31 DIAGNOSIS — K921 Melena: Secondary | ICD-10-CM

## 2019-08-31 DIAGNOSIS — R634 Abnormal weight loss: Secondary | ICD-10-CM
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
## 2019-09-01 ENCOUNTER — Telehealth: Payer: Self-pay

## 2019-09-01 LAB — HEPATITIS C ANTIBODY: Hep C Virus Ab: <0.1 s/co ratio (ref 0.0–0.9)

## 2019-09-01 NOTE — Telephone Encounter (Signed)
-----   Message from Lucilla Lame, MD sent at 09/01/2019  6:44 AM EST ----- Let the patient know that his hepatitis C was negative

## 2019-09-01 NOTE — Telephone Encounter (Signed)
Left vm on pt's girlfriend, Baraba's cell regarding lab result.

## 2019-09-06 ENCOUNTER — Telehealth: Payer: Self-pay | Admitting: Gastroenterology

## 2019-09-06 NOTE — Telephone Encounter (Signed)
Delphina Cahill called to say  she needs to cancel patient's procedure & they will call to r/s .

## 2019-09-06 NOTE — Telephone Encounter (Signed)
Pt's colonoscopy has been cancelled with [facility].

## 2019-09-09 ENCOUNTER — Encounter: Admission: RE | Payer: Self-pay | Source: Home / Self Care

## 2019-09-09 ENCOUNTER — Ambulatory Visit: Admission: RE | Admit: 2019-09-09 | Payer: Medicaid Other | Source: Home / Self Care | Admitting: Gastroenterology

## 2019-09-09 SURGERY — COLONOSCOPY WITH PROPOFOL
Anesthesia: General

## 2019-09-20 ENCOUNTER — Telehealth: Payer: Self-pay | Admitting: Gastroenterology

## 2019-09-20 ENCOUNTER — Other Ambulatory Visit: Payer: Self-pay

## 2019-09-20 DIAGNOSIS — K921 Melena: Secondary | ICD-10-CM

## 2019-09-20 NOTE — Telephone Encounter (Signed)
Pt caregiver left vm for Ginger to r/s pt procedure  [phone]

## 2019-09-20 NOTE — Telephone Encounter (Signed)
Pt rescheduled for a colonoscopy and EGD with [facility] at [facility] on 10/08/19.

## 2019-10-05 ENCOUNTER — Other Ambulatory Visit: Payer: Medicaid Other | Attending: Gastroenterology

## 2019-10-06 ENCOUNTER — Telehealth: Payer: Self-pay | Admitting: Gastroenterology

## 2019-10-06 NOTE — Telephone Encounter (Signed)
Javier Glover is calling to reschedule pt procedure for 10/08/19 she asked for Ginger to call her please

## 2019-10-07 ENCOUNTER — Other Ambulatory Visit: Payer: Self-pay

## 2019-10-07 ENCOUNTER — Telehealth: Payer: Self-pay | Admitting: Gastroenterology

## 2019-10-07 NOTE — Telephone Encounter (Signed)
Spoke with pt's fiance and has rescheduled his colonoscopy to 11/09/19. New paperwork mailed.

## 2019-10-07 NOTE — Telephone Encounter (Signed)
Pamala Hurry called stating that patient missed his Covid test & needs to be r/s for his procedure. Please cx 10-08-19.

## 2019-10-07 NOTE — Telephone Encounter (Signed)
Pt has been rescheduled for his colonoscopy. Pt scheduled for 11/09/19.

## 2019-11-05 ENCOUNTER — Other Ambulatory Visit: Admission: RE | Admit: 2019-11-05 | Payer: Medicaid Other | Source: Ambulatory Visit

## 2019-11-08 ENCOUNTER — Telehealth: Payer: Self-pay

## 2019-11-08 ENCOUNTER — Telehealth: Payer: Self-pay | Admitting: Gastroenterology

## 2019-11-08 NOTE — Telephone Encounter (Signed)
Patient states he just wants to cancel at this time but we can call him back at a later time to rescheduled this procedure

## 2019-11-08 NOTE — Telephone Encounter (Signed)
Britta Mccreedy pt fiance is calling pt did not go for his Covid 19 test but had a test done [facility]  Also she needs Instructions on diet . Please call her at cb [phone].

## 2019-11-08 NOTE — Telephone Encounter (Signed)
Trish called and states patient has not went for COVID test so he is going to need to rescheduled his procedure.

## 2019-11-08 NOTE — Telephone Encounter (Signed)
Javier Glover called back and wants you to give her a call back when you are available

## 2019-11-08 NOTE — Telephone Encounter (Signed)
Barbara left vm for Ginger needed a call ASAP regarding pt's procedure

## 2019-11-09 ENCOUNTER — Encounter: Admission: RE | Payer: Self-pay | Source: Home / Self Care

## 2019-11-09 ENCOUNTER — Telehealth: Payer: Self-pay | Admitting: Gastroenterology

## 2019-11-09 ENCOUNTER — Ambulatory Visit: Admission: RE | Admit: 2019-11-09 | Payer: Medicaid Other | Source: Home / Self Care | Admitting: Gastroenterology

## 2019-11-09 SURGERY — COLONOSCOPY WITH PROPOFOL
Anesthesia: General

## 2019-11-09 NOTE — Telephone Encounter (Signed)
Pt fiance left vm to reschedule pt GI procedure please return her call

## 2019-11-10 NOTE — Telephone Encounter (Signed)
Spoke with Britta Mccreedy regarding scheduling colonoscopy for pt. Will call back once schedule has been set up.

## 2019-11-18 ENCOUNTER — Other Ambulatory Visit: Payer: Self-pay

## 2019-11-18 DIAGNOSIS — R112 Nausea with vomiting, unspecified: Secondary | ICD-10-CM

## 2019-11-18 DIAGNOSIS — K921 Melena: Secondary | ICD-10-CM

## 2019-11-18 NOTE — Telephone Encounter (Signed)
Britta Mccreedy returned my call and we have scheduled pt for March 8th. New paperwork has been mailed.

## 2019-12-13 ENCOUNTER — Encounter: Payer: Self-pay | Admitting: Emergency Medicine

## 2019-12-13 ENCOUNTER — Other Ambulatory Visit: Payer: Self-pay

## 2019-12-13 ENCOUNTER — Emergency Department: Payer: Medicaid Other

## 2019-12-13 ENCOUNTER — Emergency Department
Admission: EM | Admit: 2019-12-13 | Discharge: 2019-12-13 | Disposition: A | Discharge status: Home / Self Care | Payer: Medicaid Other | Attending: Student | Admitting: Student

## 2019-12-13 DIAGNOSIS — Z79899 Other long term (current) drug therapy: Secondary | ICD-10-CM | POA: Diagnosis not present

## 2019-12-13 DIAGNOSIS — F17210 Nicotine dependence, cigarettes, uncomplicated: Secondary | ICD-10-CM | POA: Diagnosis not present

## 2019-12-13 DIAGNOSIS — R112 Nausea with vomiting, unspecified: Secondary | ICD-10-CM | POA: Diagnosis not present

## 2019-12-13 DIAGNOSIS — R569 Unspecified convulsions: Secondary | ICD-10-CM | POA: Insufficient documentation

## 2019-12-13 LAB — COMPREHENSIVE METABOLIC PANEL
ALT: 38 U/L (ref 0–44)
AST: 30 U/L (ref 15–41)
Albumin: 4.8 g/dL (ref 3.5–5.0)
Alkaline Phosphatase: 48 U/L (ref 38–126)
Anion gap: 11 (ref 5–15)
BUN: 23 mg/dL — ABNORMAL HIGH (ref 6–20)
CO2: 24 mmol/L (ref 22–32)
Calcium: 9.5 mg/dL (ref 8.9–10.3)
Chloride: 102 mmol/L (ref 98–111)
Creatinine, Ser: 1.01 mg/dL (ref 0.61–1.24)
GFR calc Af Amer: >60 mL/min (ref >60)
GFR calc non Af Amer: >60 mL/min (ref >60)
Glucose, Bld: 115 mg/dL — ABNORMAL HIGH (ref 70–99)
Potassium: 4.6 mmol/L (ref 3.5–5.1)
Sodium: 137 mmol/L (ref 135–145)
Total Bilirubin: 1.2 mg/dL (ref 0.3–1.2)
Total Protein: 8.8 g/dL — ABNORMAL HIGH (ref 6.5–8.1)

## 2019-12-13 LAB — URINALYSIS, COMPLETE (UACMP) WITH MICROSCOPIC
Bacteria, UA: NONE SEEN
Bilirubin Urine: NEGATIVE
Glucose, UA: NEGATIVE mg/dL
Hgb urine dipstick: NEGATIVE
Ketones, ur: NEGATIVE mg/dL
Leukocytes,Ua: NEGATIVE
Nitrite: NEGATIVE
Protein, ur: NEGATIVE mg/dL
Specific Gravity, Urine: 1.016 (ref 1.005–1.030)
pH: 5 (ref 5.0–8.0)

## 2019-12-13 LAB — URINE DRUG SCREEN, QUALITATIVE (ARMC ONLY)
Amphetamines, Ur Screen: NOT DETECTED
Barbiturates, Ur Screen: NOT DETECTED
Benzodiazepine, Ur Scrn: NOT DETECTED
Cannabinoid 50 Ng, Ur ~~LOC~~: NOT DETECTED
Cocaine Metabolite,Ur ~~LOC~~: NOT DETECTED
MDMA (Ecstasy)Ur Screen: NOT DETECTED
Methadone Scn, Ur: NOT DETECTED
Opiate, Ur Screen: NOT DETECTED
Phencyclidine (PCP) Ur S: NOT DETECTED
Tricyclic, Ur Screen: NOT DETECTED

## 2019-12-13 LAB — CBC
HCT: 40.1 % (ref 39.0–52.0)
Hemoglobin: 13.2 g/dL (ref 13.0–17.0)
MCH: 26.4 pg (ref 26.0–34.0)
MCHC: 32.9 g/dL (ref 30.0–36.0)
MCV: 80.2 fL (ref 80.0–100.0)
Platelets: 263 10*3/uL (ref 150–400)
RBC: 5 MIL/uL (ref 4.22–5.81)
RDW: 13.2 % (ref 11.5–15.5)
WBC: 9.9 10*3/uL (ref 4.0–10.5)
nRBC: 0 % (ref 0.0–0.2)

## 2019-12-13 LAB — LIPASE, BLOOD: Lipase: 26 U/L (ref 11–51)

## 2019-12-13 MED ORDER — ONDANSETRON HCL 4 MG/2ML IJ SOLN
4.0000 mg | Freq: Once | INTRAMUSCULAR | Status: AC | PRN
  Administered 2019-12-13: 10:00:00 4 mg via INTRAVENOUS
  Filled 2019-12-13: qty 2

## 2019-12-13 MED ORDER — ONDANSETRON HCL 4 MG PO TABS: 4.0000 mg | ORAL_TABLET | Freq: Three times a day (TID) | ORAL | 0 refills | Status: DC | PRN

## 2019-12-13 MED ORDER — SODIUM CHLORIDE 0.9% FLUSH
3.0000 mL | Freq: Once | INTRAVENOUS | Status: AC
  Administered 2019-12-13: 3 mL via INTRAVENOUS

## 2019-12-13 MED ORDER — SODIUM CHLORIDE 0.9 % IV BOLUS
1000.0000 mL | Freq: Once | INTRAVENOUS | Status: AC
  Administered 2019-12-13: 11:00:00 1000 mL via INTRAVENOUS

## 2019-12-13 NOTE — ED Triage Notes (Signed)
Patient's girlfriend states patient has been confused and, "out of it" this morning.  Patient has been vomiting multiple times this morning.  Patient takes omeprazole daily and has an upcoming appointment with GI.  Patient began getting sick around 6am.  Patient vomited approx. 7 times this morning and has vomited x 3 since arrival into the ED.  Patient is also complaining of abdominal pain.  Patient takes aripiprazole 100mg  and citalopram 10mg  daily.  Patient vomited x 1 yesterday.

## 2019-12-13 NOTE — ED Notes (Signed)
Pt transported to CT ?

## 2019-12-13 NOTE — Discharge Instructions (Addendum)
Thank you for letting us take care of you in the emergency department today.   Please continue to take any regular, prescribed medications.   New medications we have prescribed:  - Zofran - use as needed for nausea/vomiting  Please follow up with: - Your primary care doctor to review your ER visit and follow up on your symptoms.  - Your GI doctor appointment  Please return to the ER for any new or worsening symptoms.

## 2019-12-13 NOTE — ED Provider Notes (Signed)
[facility] Emergency Department Provider Note  ____________________________________________   First MD Initiated Contact with Patient 12/13/19 1034     (approximate)  I have reviewed the triage vital signs and the nursing notes.  History  Chief Complaint Emesis    HPI Javier Glover is a 34 y.o. male with history of substance use disorder, depression, bipolar, prior seizures (not on any antiepileptics) who presents the emergency department for ongoing nausea and vomiting.  Majority of history obtained by the patient's partner at bedside as patient defers answers to her.  Partner states that the patient has been having multiple daily episodes of nausea and vomiting over the last 2 months.  She states he vomits essentially on a daily basis.  Today however, his vomiting was more severe than his baseline in terms of number of episodes.  They state he has vomited at least 10 times today.  Nonbloody.  Patient reports associated abdominal pain related to episodes of emesis.  Aching.  Located to the epigastrium and mid abdomen.  No radiation.  No alleviating or aggravating components.  No diarrhea.  No fevers. No sick contacts.  Patient has been referred to GI for further evaluation & colonoscopy, currently scheduled for beginning of March after multiple cancellations/reschedules.  Patient denies any alcohol or drug use.   Past Medical Hx Past Medical History:  Diagnosis Date  . Seizures [facility])     Problem List Patient Active Problem List   Diagnosis Date Noted  . Bipolar 1 disorder ([place])   . Aggressive behavior   . Nausea and vomiting 06/10/2019  . MDD (major depressive disorder), recurrent severe, without psychosis ([place]) 11/17/2018  . Cannabis use disorder, moderate, dependence ([place]) 11/17/2018  . 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 Tobacco use disorder 11/17/2018  . Intellectual disability 11/17/2018  . Suicidal behavior  11/16/2018  . Cocaine use disorder, moderate, dependence ([place]) 11/16/2018  . Alcohol use disorder, severe, dependence ([place]) 11/16/2018  . Malingering 12/24/2017  . Adult antisocial behavior 03/29/2016  . Substance induced mood disorder ([place]) 03/29/2016  . Suicidal ideation 03/25/2016    Past Surgical Hx Past Surgical History:  Procedure Laterality Date  . FRACTURE SURGERY      Medications Prior to Admission medications   Medication Sig Start Date End Date Taking? Authorizing Provider  citalopram (CELEXA) 10 MG tablet Take 1 tablet (10 mg total) by mouth daily. 08/12/19   Clapacs, Madie Reno, MD  omeprazole (PRILOSEC) 10 MG capsule Take 10 mg by mouth daily.    [provider]    Allergies Ibuprofen  Family Hx No family history on file.  Social Hx Social History   Tobacco Use  . Smoking status: Current Every Day Smoker    Packs/day: 0.50    Years: 14.00    Pack years: 7.00    Types: Cigarettes  . Smokeless tobacco: Never Used  Substance Use Topics  . Alcohol use: Yes    Comment: occ  . Drug use: Yes    Types: Cocaine     Review of Systems  Constitutional: Negative for fever, chills. Eyes: Negative for visual changes. ENT: Negative for sore throat. Cardiovascular: Negative for chest pain. Respiratory: Negative for shortness of breath. Gastrointestinal: + for nausea, vomiting.  Genitourinary: Negative for dysuria. Musculoskeletal: Negative for leg swelling. Skin: Negative for rash. Neurological: Negative for headaches.   Physical Exam  Vital Signs: ED Triage Vitals  Enc Vitals Group     BP 12/13/19 0918 106/76  Pulse Rate 12/13/19 0918 (!) 119     Resp 12/13/19 0918 18     Temp 12/13/19 0918 98.8 F (37.1 C)     Temp Source 12/13/19 0918 Oral     SpO2 12/13/19 0918 95 %     Weight 12/13/19 0948 200 lb (90.7 kg)     Height 12/13/19 0948 5\' 6"  (1.676 m)     Head Circumference --      Peak Flow --      Pain Score 12/13/19 0948 10     Pain  Loc --      Pain Edu? --      Excl. in GC? --     Constitutional: Alert and oriented.  Head: Normocephalic. Atraumatic. Eyes: Conjunctivae clear. Sclera anicteric. Nose: No congestion. No rhinorrhea. Mouth/Throat: Wearing mask.  MM moist. Neck: No stridor.   Cardiovascular: Normal rate, regular rhythm. Extremities well perfused. Respiratory: Normal respiratory effort.  Lungs CTAB. Gastrointestinal: Soft. Non-tender throughout.  No rebound or guarding.  Non-distended.  Musculoskeletal: No lower extremity edema. No deformities. Neurologic:  Normal speech and language. No gross focal neurologic deficits are appreciated.  Skin: Skin is warm, dry and intact. No rash noted. Psychiatric: Mood and affect are appropriate for situation.    Radiology  CT:  IMPRESSION:  No acute intracranial abnormalities. Normal brain.    Procedures  Procedure(s) performed (including critical care):  Procedures   Initial Impression / Assessment and Plan / ED Course  34 y.o. male who presents to the ED for chronic nausea and vomiting, as above  Ddx: gastroenteritis, electrolyte abnormality, undiagnosed diabetes, pancreatitis, cannabinoid hyperemesis, narcotic withdrawal, though this seems less likely as patient has been sober for several months now. Quite rare, but could consider intracranial mass lesion. Consider psychogenic, though this would be diagnosis of exclusion.  Will evaluate with labs, urine studies, imaging, fluids, antiemetics  Electrolytes without actionable derangements.  Glucose 115, normal bicarb, no ketones in urine, no gap, therefore doubt new onset DKA.  CT head negative.  Urine and UDS negative.  Patient reports complete resolution in symptoms, tolerated multiple PO, ice cream in the emergency department without issue.  As such, he is stable for discharge with outpatient follow-up.  Rx for Zofran provided as needed.  Given return precautions.  Patient and partner voiced  understanding and are comfortable to plan and discharge.   Final Clinical Impression(s) / ED Diagnosis  Final diagnoses:  Nausea and vomiting in adult patient       Note:  This document was prepared using Dragon voice recognition software and may include unintentional dictation errors.   20., MD 12/13/19 [id number]

## 2019-12-13 NOTE — ED Triage Notes (Signed)
Patient presents to the ED for nausea and vomiting.  Patient states, "I've been vomiting all morning".  Patient states he would like his fiance to be with him.  This RN explained our visitation policy.  Patient states if his wife can't be with him, then he just wants to leave and stay sick.

## 2019-12-29 ENCOUNTER — Encounter: Payer: Self-pay | Admitting: Gastroenterology

## 2019-12-29 ENCOUNTER — Other Ambulatory Visit: Payer: Self-pay

## 2019-12-30 ENCOUNTER — Other Ambulatory Visit
Admission: RE | Admit: 2019-12-30 | Discharge: 2019-12-30 | Disposition: A | Discharge status: Home / Self Care | Payer: Medicaid Other | Source: Ambulatory Visit | Attending: Gastroenterology | Admitting: Gastroenterology

## 2019-12-30 ENCOUNTER — Encounter: Payer: Self-pay | Admitting: Gastroenterology

## 2019-12-30 DIAGNOSIS — Z20822 Contact with and (suspected) exposure to covid-19: Secondary | ICD-10-CM | POA: Diagnosis not present

## 2019-12-30 DIAGNOSIS — Z01812 Encounter for preprocedural laboratory examination: Secondary | ICD-10-CM | POA: Insufficient documentation

## 2019-12-30 NOTE — Anesthesia Preprocedure Evaluation (Addendum)
Anesthesia Evaluation  Patient identified by MRN, date of birth, ID band Patient awake    Airway Mallampati: II  TM Distance: >3 FB Neck ROM: Full    Dental   Pulmonary Current Smoker and Patient abstained from smoking.,    Pulmonary exam normal        Cardiovascular negative cardio ROS Normal cardiovascular exam     Neuro/Psych PSYCHIATRIC DISORDERS Depression Bipolar Disorder Significant substance abuse history- UDS two weeks ago was negative.   GI/Hepatic   Endo/Other    Renal/GU      Musculoskeletal   Abdominal   Peds  Hematology   Anesthesia Other Findings   Reproductive/Obstetrics                            Anesthesia Physical Anesthesia Plan  ASA: II  Anesthesia Plan: General   Post-op Pain Management:    Induction: Intravenous  PONV Risk Score and Plan:   Airway Management Planned: Natural Airway  Additional Equipment:   Intra-op Plan:   Post-operative Plan:   Informed Consent: I have reviewed the patients History and Physical, chart, labs and discussed the procedure including the risks, benefits and alternatives for the proposed anesthesia with the patient or authorized representative who has indicated his/her understanding and acceptance.       Plan Discussed with: CRNA, Anesthesiologist and Surgeon  Anesthesia Plan Comments:         Anesthesia Quick Evaluation

## 2019-12-31 LAB — SARS CORONAVIRUS 2 (TAT 6-24 HRS): SARS Coronavirus 2: NEGATIVE

## 2019-12-31 NOTE — Discharge Instructions (Signed)
General Anesthesia, Adult, Care After This sheet gives you information about how to care for yourself after your procedure. Your health care provider may also give you more specific instructions. If you have problems or questions, contact your health care provider. What can I expect after the procedure? After the procedure, the following side effects are common:  Pain or discomfort at the IV site.  Nausea.  Vomiting.  Sore throat.  Trouble concentrating.  Feeling cold or chills.  Weak or tired.  Sleepiness and fatigue.  Soreness and body aches. These side effects can affect parts of the body that were not involved in surgery. Follow these instructions at home:  For at least 24 hours after the procedure:  Have a responsible adult stay with you. It is important to have someone help care for you until you are awake and alert.  Rest as needed.  Do not: ? Participate in activities in which you could fall or become injured. ? Drive. ? Use heavy machinery. ? Drink alcohol. ? Take sleeping pills or medicines that cause drowsiness. ? Make important decisions or sign legal documents. ? Take care of children on your own. Eating and drinking  Follow any instructions from your health care provider about eating or drinking restrictions.  When you feel hungry, start by eating small amounts of foods that are soft and easy to digest (bland), such as toast. Gradually return to your regular diet.  Drink enough fluid to keep your urine pale yellow.  If you vomit, rehydrate by drinking water, juice, or clear broth. General instructions  If you have sleep apnea, surgery and certain medicines can increase your risk for breathing problems. Follow instructions from your health care provider about wearing your sleep device: ? Anytime you are sleeping, including during daytime naps. ? While taking prescription pain medicines, sleeping medicines, or medicines that make you drowsy.  Return to  your normal activities as told by your health care provider. Ask your health care provider what activities are safe for you.  Take over-the-counter and prescription medicines only as told by your health care provider.  If you smoke, do not smoke without supervision.  Keep all follow-up visits as told by your health care provider. This is important. Contact a health care provider if:  You have nausea or vomiting that does not get better with medicine.  You cannot eat or drink without vomiting.  You have pain that does not get better with medicine.  You are unable to pass urine.  You develop a skin rash.  You have a fever.  You have redness around your IV site that gets worse. Get help right away if:  You have difficulty breathing.  You have chest pain.  You have blood in your urine or stool, or you vomit blood. Summary  After the procedure, it is common to have a sore throat or nausea. It is also common to feel tired.  Have a responsible adult stay with you for the first 24 hours after general anesthesia. It is important to have someone help care for you until you are awake and alert.  When you feel hungry, start by eating small amounts of foods that are soft and easy to digest (bland), such as toast. Gradually return to your regular diet.  Drink enough fluid to keep your urine pale yellow.  Return to your normal activities as told by your health care provider. Ask your health care provider what activities are safe for you. This information is not   intended to replace advice given to you by your health care provider. Make sure you discuss any questions you have with your health care provider. Document Revised: 10/17/2017 Document Reviewed: 05/30/2017 Elsevier Patient Education  2020 [employer]vier Inc.  

## 2020-01-03 ENCOUNTER — Ambulatory Visit: Payer: Medicaid Other | Admitting: Anesthesiology

## 2020-01-03 ENCOUNTER — Other Ambulatory Visit: Payer: Self-pay

## 2020-01-03 ENCOUNTER — Encounter
Admission: RE | Disposition: A | Discharge status: Home / Self Care | Payer: Self-pay | Source: Home / Self Care | Attending: Gastroenterology

## 2020-01-03 ENCOUNTER — Encounter: Payer: Self-pay | Admitting: Gastroenterology

## 2020-01-03 ENCOUNTER — Ambulatory Visit
Admission: RE | Admit: 2020-01-03 | Discharge: 2020-01-03 | Disposition: A | Discharge status: Home / Self Care | Payer: Medicaid Other | Attending: Gastroenterology | Admitting: Gastroenterology

## 2020-01-03 DIAGNOSIS — K921 Melena: Secondary | ICD-10-CM

## 2020-01-03 DIAGNOSIS — K640 First degree hemorrhoids: Secondary | ICD-10-CM | POA: Insufficient documentation

## 2020-01-03 DIAGNOSIS — Z886 Allergy status to analgesic agent status: Secondary | ICD-10-CM | POA: Diagnosis not present

## 2020-01-03 DIAGNOSIS — K7460 Unspecified cirrhosis of liver: Secondary | ICD-10-CM | POA: Diagnosis not present

## 2020-01-03 DIAGNOSIS — F319 Bipolar disorder, unspecified: Secondary | ICD-10-CM | POA: Diagnosis not present

## 2020-01-03 DIAGNOSIS — Z79899 Other long term (current) drug therapy: Secondary | ICD-10-CM | POA: Insufficient documentation

## 2020-01-03 DIAGNOSIS — K920 Hematemesis: Secondary | ICD-10-CM | POA: Diagnosis not present

## 2020-01-03 DIAGNOSIS — F17210 Nicotine dependence, cigarettes, uncomplicated: Secondary | ICD-10-CM | POA: Diagnosis not present

## 2020-01-03 DIAGNOSIS — R634 Abnormal weight loss: Secondary | ICD-10-CM | POA: Diagnosis not present

## 2020-01-03 DIAGNOSIS — R112 Nausea with vomiting, unspecified: Secondary | ICD-10-CM | POA: Diagnosis not present

## 2020-01-03 DIAGNOSIS — K449 Diaphragmatic hernia without obstruction or gangrene: Secondary | ICD-10-CM | POA: Diagnosis not present

## 2020-01-03 DIAGNOSIS — A699 Spirochetal infection, unspecified: Secondary | ICD-10-CM | POA: Insufficient documentation

## 2020-01-03 HISTORY — PX: ESOPHAGOGASTRODUODENOSCOPY (EGD) WITH PROPOFOL: SHX5813

## 2020-01-03 HISTORY — DX: Unspecified cirrhosis of liver: K74.60

## 2020-01-03 HISTORY — PX: COLONOSCOPY WITH PROPOFOL: SHX5780

## 2020-01-03 SURGERY — COLONOSCOPY WITH PROPOFOL
Anesthesia: General | Site: Rectum

## 2020-01-03 MED ORDER — PROPOFOL 10 MG/ML IV BOLUS
INTRAVENOUS | Status: DC | PRN
  Administered 2020-01-03 (×2): 30 mg via INTRAVENOUS
  Administered 2020-01-03: 50 mg via INTRAVENOUS
  Administered 2020-01-03: 30 mg via INTRAVENOUS
  Administered 2020-01-03: 50 mg via INTRAVENOUS
  Administered 2020-01-03: 30 mg via INTRAVENOUS
  Administered 2020-01-03: 100 mg via INTRAVENOUS
  Administered 2020-01-03: 30 mg via INTRAVENOUS

## 2020-01-03 MED ORDER — STERILE WATER FOR IRRIGATION IR SOLN
Status: DC | PRN
  Administered 2020-01-03: 100 mL

## 2020-01-03 MED ORDER — GLYCOPYRROLATE 0.2 MG/ML IJ SOLN
INTRAMUSCULAR | Status: DC | PRN
  Administered 2020-01-03: .2 mg via INTRAVENOUS

## 2020-01-03 MED ORDER — LACTATED RINGERS IV SOLN: INTRAVENOUS | Status: DC

## 2020-01-03 MED ORDER — LIDOCAINE HCL (CARDIAC) PF 100 MG/5ML IV SOSY
PREFILLED_SYRINGE | INTRAVENOUS | Status: DC | PRN
  Administered 2020-01-03: 20 mg via INTRAVENOUS

## 2020-01-03 SURGICAL SUPPLY — 7 items
BLOCK BITE 60FR ADLT L/F GRN (MISCELLANEOUS) ×4 IMPLANT
CANISTER SUCT 1200ML W/VALVE (MISCELLANEOUS) ×4 IMPLANT
FORCEPS BIOP RAD 4 LRG CAP 4 (CUTTING FORCEPS) ×2 IMPLANT
GOWN CVR UNV OPN BCK APRN NK (MISCELLANEOUS) ×4 IMPLANT
GOWN ISOL THUMB LOOP REG UNIV (MISCELLANEOUS) ×8
KIT ENDO PROCEDURE OLY (KITS) ×4 IMPLANT
WATER STERILE IRR 250ML POUR (IV SOLUTION) ×4 IMPLANT

## 2020-01-03 NOTE — Transfer of Care (Signed)
Immediate Anesthesia Transfer of Care Note  Patient: Javier Glover  Procedure(s) Performed: COLONOSCOPY WITH BIOPSY (N/A Rectum) ESOPHAGOGASTRODUODENOSCOPY (EGD) WITH PROPOFOL (N/A Mouth)  Patient Location: PACU  Anesthesia Type: General  Level of Consciousness: awake, alert  and patient cooperative  Airway and Oxygen Therapy: Patient Spontanous Breathing and Patient connected to supplemental oxygen  Post-op Assessment: Post-op Vital signs reviewed, Patient's Cardiovascular Status Stable, Respiratory Function Stable, Patent Airway and No signs of Nausea or vomiting  Post-op Vital Signs: Reviewed and stable  Complications: No apparent anesthesia complications

## 2020-01-03 NOTE — Anesthesia Postprocedure Evaluation (Signed)
Anesthesia Post Note  Patient: MINOR IDEN  Procedure(s) Performed: COLONOSCOPY WITH BIOPSY (N/A Rectum) ESOPHAGOGASTRODUODENOSCOPY (EGD) WITH PROPOFOL (N/A Mouth)     Patient location during evaluation: PACU Anesthesia Type: General Level of consciousness: awake and alert Pain management: pain level controlled Vital Signs Assessment: post-procedure vital signs reviewed and stable Respiratory status: spontaneous breathing, nonlabored ventilation, respiratory function stable and patient connected to nasal cannula oxygen Cardiovascular status: blood pressure returned to baseline and stable Postop Assessment: no apparent nausea or vomiting Anesthetic complications: no    Gayland Curry Baylyn Sickles

## 2020-01-03 NOTE — Anesthesia Procedure Notes (Signed)
Procedure Name: MAC Performed by: Vanetta Shawl, CRNA Pre-anesthesia Checklist: Patient identified, Emergency Drugs available, Suction available, Timeout performed and Patient being monitored Patient Re-evaluated:Patient Re-evaluated prior to induction Oxygen Delivery Method: Nasal cannula Placement Confirmation: positive ETCO2

## 2020-01-03 NOTE — Op Note (Signed)
[facility] Gastroenterology Patient Name: Javier Glover Procedure Date: 01/03/2020 8:22 AM MRN: [id number] Account #: [id number] Date of Birth: October 05, 1986 Admit Type: Outpatient Age: 34 Room: [facility] OR ROOM 01 Gender: Male Note Status: Finalized Procedure:             Upper GI endoscopy Indications:           Hematemesis, Nausea with vomiting, Weight loss Providers:             Midge Minium MD, MD Referring MD:          [facility] (Referring MD) Medicines:             Propofol per Anesthesia Complications:         No immediate complications. Procedure:             Pre-Anesthesia Assessment:                        - Prior to the procedure, a History and Physical was                         performed, and patient medications and allergies were                         reviewed. The patient's tolerance of previous                         anesthesia was also reviewed. The risks and benefits                         of the procedure and the sedation options and risks                         were discussed with the patient. All questions were                         answered, and informed consent was obtained. Prior                         Anticoagulants: The patient has taken no previous                         anticoagulant or antiplatelet agents. ASA Grade                         Assessment: II - A patient with mild systemic disease.                         After reviewing the risks and benefits, the patient                         was deemed in satisfactory condition to undergo the                         procedure.                        After obtaining informed consent, the endoscope was  passed under direct vision. Throughout the procedure,                         the patient's blood pressure, pulse, and oxygen                         saturations were monitored continuously. The was                         introduced through  the mouth, and advanced to the                         second part of duodenum. The upper GI endoscopy was                         accomplished without difficulty. The patient tolerated                         the procedure well. Findings:      A small hiatal hernia was present.      A medium amount of food (residue) was found in the entire examined       stomach.      The examined duodenum was normal. Impression:            - Small hiatal hernia.                        - A medium amount of food (residue) in the stomach.                        - Normal examined duodenum.                        - No specimens collected. Recommendation:        - Resume previous diet.                        - Continue present medications.                        - Perform a colonoscopy today.                        - Do a gastric emptying study. Procedure Code(s):     --- Professional ---                        [phone], Esophagogastroduodenoscopy, flexible,                         transoral; diagnostic, including collection of                         specimen(s) by brushing or washing, when performed                         (separate procedure) Diagnosis Code(s):     --- Professional ---                        R63.4, Abnormal weight loss  R11.2, Nausea with vomiting, unspecified CPT copyright 2019 American Medical Association. All rights reserved. The codes documented in this report are preliminary and upon coder review may  be revised to meet current compliance requirements. Lucilla Lame MD, MD 01/03/2020 8:43:30 AM This report has been signed electronically. Number of Addenda: 0 Note Initiated On: 01/03/2020 8:22 AM Estimated Blood Loss:  Estimated blood loss: none.      [facility]

## 2020-01-03 NOTE — Op Note (Signed)
[facility] Gastroenterology Patient Name: Javier Glover Procedure Date: 01/03/2020 8:22 AM MRN: [id number] Account #: [id number] Date of Birth: 08/26/1986 Admit Type: Outpatient Age: 34 Room: [facility] OR ROOM 01 Gender: Male Note Status: Finalized Procedure:             Colonoscopy Indications:           Hematochezia, Weight loss Providers:             Midge Minium MD, MD Medicines:             Propofol per Anesthesia Complications:         No immediate complications. Procedure:             Pre-Anesthesia Assessment:                        - Prior to the procedure, a History and Physical was                         performed, and patient medications and allergies were                         reviewed. The patient's tolerance of previous                         anesthesia was also reviewed. The risks and benefits                         of the procedure and the sedation options and risks                         were discussed with the patient. All questions were                         answered, and informed consent was obtained. Prior                         Anticoagulants: The patient has taken no previous                         anticoagulant or antiplatelet agents. ASA Grade                         Assessment: II - A patient with mild systemic disease.                         After reviewing the risks and benefits, the patient                         was deemed in satisfactory condition to undergo the                         procedure.                        After obtaining informed consent, the colonoscope was                         passed under direct vision. Throughout the procedure,  the patient's blood pressure, pulse, and oxygen                         saturations were monitored continuously. The was                         introduced through the anus and advanced to the the                         terminal ileum. The colonoscopy was  performed without                         difficulty. The patient tolerated the procedure well.                         The quality of the bowel preparation was fair. Findings:      The perianal and digital rectal examinations were normal.      The terminal ileum appeared normal. Biopsies were taken with a cold       forceps for histology.      Non-bleeding internal hemorrhoids were found during retroflexion. The       hemorrhoids were Grade I (internal hemorrhoids that do not prolapse).      Biopsies were taken with a cold forceps in the entire colon for       histology. Impression:            - Preparation of the colon was fair.                        - The examined portion of the ileum was normal.                         Biopsied.                        - Non-bleeding internal hemorrhoids.                        - Biopsies were taken with a cold forceps for                         histology in the entire colon. Recommendation:        - Discharge patient to home.                        - Resume previous diet.                        - Continue present medications.                        - Await pathology results.                        - If biopsies normal then consider non Gi causes of                         his symptoms. Procedure Code(s):     --- Professional ---  03795, Colonoscopy, flexible; with biopsy, single or                         multiple Diagnosis Code(s):     --- Professional ---                        K92.1, Melena (includes Hematochezia)                        R63.4, Abnormal weight loss CPT copyright 2019 American Medical Association. All rights reserved. The codes documented in this report are preliminary and upon coder review may  be revised to meet current compliance requirements. Midge Minium MD, MD 01/03/2020 8:56:49 AM This report has been signed electronically. Number of Addenda: 0 Note Initiated On: 01/03/2020 8:22 AM Scope Withdrawal  Time: 0 hours 6 minutes 35 seconds  Total Procedure Duration: 0 hours 9 minutes 44 seconds  Estimated Blood Loss:  Estimated blood loss: none.      [facility]

## 2020-01-03 NOTE — H&P (Signed)
Lucilla Lame, MD [facility] [address] [place], [place] [address] Phone:[phone] Fax : [phone]  Primary Care Physician:  [facility] [address] Primary Gastroenterologist:  Dr. Allen Norris  Pre-Procedure History & Physical: HPI:  Javier Glover is a 34 y.o. male is here for an endoscopy and colonoscopy.   Past Medical History:  Diagnosis Date  . Cirrhosis ([place])   . Seizures ([place])    Childhood.  Medications for a while.  No meds or seizures for "a long time".    Past Surgical History:  Procedure Laterality Date  . FRACTURE SURGERY      Prior to Admission medications   Medication Sig Start Date End Date Taking? Authorizing Provider  citalopram (CELEXA) 10 MG tablet Take 1 tablet (10 mg total) by mouth daily. 08/12/19  Yes Clapacs, Madie Reno, MD  omeprazole (PRILOSEC) 10 MG capsule Take 10 mg by mouth daily.   Yes [provider]  ondansetron (ZOFRAN) 4 MG tablet Take 1 tablet (4 mg total) by mouth every 8 (eight) hours as needed for nausea, vomiting or refractory nausea / vomiting. 12/13/19  Yes Lilia Pro., MD    Allergies as of 11/18/2019 - Review Complete 08/31/2019  Allergen Reaction Noted  . Ibuprofen Rash 10/09/2014    History reviewed. No pertinent family history.  Social History   Socioeconomic History  . Marital status: Single    Spouse name: Not on file  . Number of children: Not on file  . Years of education: Not on file  . Highest education level: Not on file  Occupational History  . Not on file  Tobacco Use  . Smoking status: Current Every Day Smoker    Packs/day: 0.50    Years: 14.00    Pack years: 7.00    Types: Cigarettes  . Smokeless tobacco: Never Used  . Tobacco comment: 12/29/19 - currently smokes 1 pack/week  Substance and Sexual Activity  . Alcohol use: Not Currently    Comment: occ  . Drug use: Not Currently    Types: Cocaine    Comment: pt states none since before Christmas (2020)  . Sexual activity:  Not on file  Other Topics Concern  . Not on file  Social History Narrative  . Not on file   Social Determinants of Health   Financial Resource Strain:   . Difficulty of Paying Living Expenses: Not on file  Food Insecurity:   . Worried About Charity fundraiser in the Last Year: Not on file  . Ran Out of Food in the Last Year: Not on file  Transportation Needs:   . Lack of Transportation (Medical): Not on file  . Lack of Transportation (Non-Medical): Not on file  Physical Activity:   . Days of Exercise per Week: Not on file  . Minutes of Exercise per Session: Not on file  Stress:   . Feeling of Stress : Not on file  Social Connections:   . Frequency of Communication with Friends and Family: Not on file  . Frequency of Social Gatherings with Friends and Family: Not on file  . Attends Religious Services: Not on file  . Active Member of Clubs or Organizations: Not on file  . Attends Archivist Meetings: Not on file  . Marital Status: Not on file  Intimate Partner Violence:   . Fear of Current or Ex-Partner: Not on file  . Emotionally Abused: Not on file  . Physically Abused: Not on file  . Sexually  Abused: Not on file    Review of Systems: See HPI, otherwise negative ROS  Physical Exam: Ht 5\' 6"  (1.676 m)   Wt 90.7 kg   BMI 32.28 kg/m  General:   Alert,  pleasant and cooperative in NAD Head:  Normocephalic and atraumatic. Neck:  Supple; no masses or thyromegaly. Lungs:  Clear throughout to auscultation.    Heart:  Regular rate and rhythm. Abdomen:  Soft, nontender and nondistended. Normal bowel sounds, without guarding, and without rebound.   Neurologic:  Alert and  oriented x4;  grossly normal neurologically.  Impression/Plan: is here for an endoscopy and colonoscopy to be performed for hematemesis nausea vomiting and weight loss   Risks, benefits, limitations, and alternatives regarding  endoscopy and colonoscopy have been reviewed with the  patient.  Questions have been answered.  All parties agreeable.   Lorenz Coaster, MD  01/03/2020, 8:19 AM

## 2020-01-04 ENCOUNTER — Encounter: Payer: Self-pay | Admitting: *Deleted

## 2020-01-04 LAB — SURGICAL PATHOLOGY

## 2020-01-05 ENCOUNTER — Telehealth: Payer: Self-pay | Admitting: Gastroenterology

## 2020-01-05 ENCOUNTER — Telehealth: Payer: Self-pay

## 2020-01-05 ENCOUNTER — Other Ambulatory Visit: Payer: Self-pay

## 2020-01-05 DIAGNOSIS — R197 Diarrhea, unspecified: Secondary | ICD-10-CM

## 2020-01-05 MED ORDER — METRONIDAZOLE 500 MG PO TABS: 500.0000 mg | ORAL_TABLET | Freq: Three times a day (TID) | ORAL | 0 refills | Status: DC

## 2020-01-05 NOTE — Telephone Encounter (Signed)
LVM for pt to return my call.

## 2020-01-05 NOTE — Telephone Encounter (Signed)
Pt's fiance returned call and was notified of procedure results, lab and prescription.

## 2020-01-05 NOTE — Telephone Encounter (Signed)
-----   Message from Midge Minium, MD sent at 01/05/2020  8:28 AM EST ----- Let the patient know that the biopsies of his colon showed him to have an infection of his colon.  The patient should start on flagyl 500mg  tid for 10 days and he needs to be checked for HIV because there is sometimes an association.

## 2020-01-05 NOTE — Telephone Encounter (Signed)
Britta Mccreedy called & wanted to speak with Ginger to go over patient's results.

## 2020-01-11 ENCOUNTER — Other Ambulatory Visit: Payer: Self-pay

## 2020-01-11 DIAGNOSIS — R197 Diarrhea, unspecified: Secondary | ICD-10-CM

## 2020-01-12 LAB — HIV ANTIBODY (ROUTINE TESTING W REFLEX): HIV Screen 4th Generation wRfx: NONREACTIVE

## 2020-01-12 NOTE — Telephone Encounter (Signed)
-----   Message from Midge Minium, MD sent at 01/12/2020  7:50 AM EDT ----- Let the patient know that his HIV test was negative.

## 2020-01-12 NOTE — Telephone Encounter (Signed)
Pt's fiance' notified of lab results via voicemail.

## 2020-06-06 ENCOUNTER — Encounter: Payer: Self-pay | Admitting: *Deleted

## 2020-06-06 ENCOUNTER — Emergency Department
Admission: EM | Admit: 2020-06-06 | Discharge: 2020-06-06 | Disposition: A | Discharge status: Home / Self Care | Payer: Medicaid Other | Attending: Emergency Medicine | Admitting: Emergency Medicine

## 2020-06-06 ENCOUNTER — Other Ambulatory Visit: Payer: Self-pay

## 2020-06-06 DIAGNOSIS — F17210 Nicotine dependence, cigarettes, uncomplicated: Secondary | ICD-10-CM | POA: Insufficient documentation

## 2020-06-06 DIAGNOSIS — Y939 Activity, unspecified: Secondary | ICD-10-CM | POA: Diagnosis not present

## 2020-06-06 DIAGNOSIS — Y999 Unspecified external cause status: Secondary | ICD-10-CM | POA: Diagnosis not present

## 2020-06-06 DIAGNOSIS — S81812A Laceration without foreign body, left lower leg, initial encounter: Secondary | ICD-10-CM | POA: Insufficient documentation

## 2020-06-06 DIAGNOSIS — Y929 Unspecified place or not applicable: Secondary | ICD-10-CM | POA: Insufficient documentation

## 2020-06-06 DIAGNOSIS — W260XXA Contact with knife, initial encounter: Secondary | ICD-10-CM | POA: Insufficient documentation

## 2020-06-06 DIAGNOSIS — S8990XA Unspecified injury of unspecified lower leg, initial encounter: Secondary | ICD-10-CM | POA: Diagnosis present

## 2020-06-06 MED ORDER — CEPHALEXIN 500 MG PO CAPS: 500.0000 mg | ORAL_CAPSULE | Freq: Four times a day (QID) | ORAL | 0 refills | Status: AC

## 2020-06-06 MED ORDER — LIDOCAINE HCL (PF) 1 % IJ SOLN
INTRAMUSCULAR | Status: AC
  Administered 2020-06-06: 10 mL via INTRADERMAL
  Filled 2020-06-06: qty 10

## 2020-06-06 MED ORDER — TETANUS-DIPHTH-ACELL PERTUSSIS 5-2.5-18.5 LF-MCG/0.5 IM SUSP
0.5000 mL | Freq: Once | INTRAMUSCULAR | Status: DC
  Filled 2020-06-06: qty 0.5

## 2020-06-06 MED ORDER — LIDOCAINE-EPINEPHRINE 2 %-1:100000 IJ SOLN: 30.0000 mL | Freq: Once | INTRAMUSCULAR | Status: DC

## 2020-06-06 MED ORDER — LIDOCAINE HCL (PF) 1 % IJ SOLN: 10.0000 mL | Freq: Once | INTRAMUSCULAR | Status: AC

## 2020-06-06 MED ORDER — ACETAMINOPHEN 500 MG PO TABS
1000.0000 mg | ORAL_TABLET | Freq: Once | ORAL | Status: AC
  Administered 2020-06-06: 1000 mg via ORAL
  Filled 2020-06-06: qty 2

## 2020-06-06 NOTE — ED Triage Notes (Signed)
Pt arrives ambulatory to triage room, stating he "fell and cut my leg with a butchers knife". He has a deep lac to the left lateral lower leg. Bleeding controlled. New bandage applied to the area.

## 2020-06-06 NOTE — ED Provider Notes (Signed)
[facility] Emergency Department Provider Note  ____________________________________________   First MD Initiated Contact with Patient 06/06/20 1921     (approximate)  I have reviewed the triage vital signs and the nursing notes.   HISTORY  Chief Complaint Laceration   HPI Javier Glover is a 34 y.o. male with a past medical history of cirrhosis and childhood seizures not currently on any AEDs who presents for assessment of a laceration he sustained to his left lower leg after he tripped while in his own kitchen falling into a butcher knife.  Patient states this occurred to me late prior to arrival.  No other injuries or lacerations.  Patient states he is otherwise been in his usual state of health without any recent fevers, chills, cough, nausea, vomiting, diarrhea, dysuria, rash, or any other acute complaints.  He does not recall when his last tetanus shot was.  No medications prior to arrival.         Past Medical History:  Diagnosis Date   Cirrhosis ([facility])    Seizures ([facility])    Childhood.  Medications for a while.  No meds or seizures for "a long time".    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   Tobacco use disorder 11/17/2018   Intellectual disability 11/17/2018   Suicidal behavior 11/16/2018   Cocaine use disorder, moderate, dependence (HCC) 11/16/2018   Alcohol use disorder, severe, dependence (HCC) 11/16/2018   Malingering 12/24/2017   Adult antisocial behavior 03/29/2016   Substance induced mood disorder ([facility]) 03/29/2016   Suicidal ideation 03/25/2016    Past Surgical  History:  Procedure Laterality Date   COLONOSCOPY WITH PROPOFOL N/A 01/03/2020   Procedure: COLONOSCOPY WITH BIOPSY;  Surgeon: Midge Minium, MD;  Location: [facility];  Service: Endoscopy;  Laterality: N/A;  PRIORITY 3   ESOPHAGOGASTRODUODENOSCOPY (EGD) WITH PROPOFOL N/A 01/03/2020   Procedure: ESOPHAGOGASTRODUODENOSCOPY (EGD) WITH PROPOFOL;  Surgeon: Midge Minium, MD;  Location: [facility];  Service: Endoscopy;  Laterality: N/A;  PRIORITY 3   FRACTURE SURGERY      Prior to Admission medications   Medication Sig Start Date End Date Taking? Authorizing Provider  cephALEXin (KEFLEX) 500 MG capsule Take 1 capsule (500 mg total) by mouth 4 (four) times daily for 7 days. 06/06/20 06/13/20  Gilles Chiquito, MD  citalopram (CELEXA) 10 MG tablet Take 1 tablet (10 mg total) by mouth daily. 08/12/19   Clapacs, Jackquline Denmark, MD  metroNIDAZOLE (FLAGYL) 500 MG tablet Take 1 tablet (500 mg total) by mouth 3 (three) times daily. 01/05/20   Midge Minium, MD  omeprazole (PRILOSEC) 10 MG capsule Take 10 mg by mouth daily.    [provider]  ondansetron (ZOFRAN) 4 MG tablet Take 1 tablet (4 mg total) by mouth every 8 (eight) hours as needed for nausea, vomiting or refractory nausea / vomiting. 12/13/19   Miguel Aschoff., MD    Allergies Ibuprofen  No family history on file.  Social History Social History   Tobacco Use   Smoking status: Current Every Day Smoker    Packs/day: 0.50    Years: 14.00  Pack years: 7.00    Types: Cigarettes   Smokeless tobacco: Never Used   Tobacco comment: 12/29/19 - currently smokes 1 pack/week  Vaping Use   Vaping Use: Former  Substance Use Topics   Alcohol use: Not Currently    Comment: occ   Drug use: Not Currently    Types: Cocaine    Comment: pt states none since before Christmas (2020)    Review of Systems  Review of Systems  Constitutional: Negative for chills and fever.  HENT: Negative for sore throat.   Eyes: Negative for  pain.  Respiratory: Negative for cough and stridor.   Cardiovascular: Negative for chest pain.  Gastrointestinal: Negative for vomiting.  Musculoskeletal:       Cut to left lower leg   Skin: Negative for rash.  Neurological: Negative for seizures, loss of consciousness and headaches.  Psychiatric/Behavioral: Negative for suicidal ideas.  All other systems reviewed and are negative.     ____________________________________________   PHYSICAL EXAM:  VITAL SIGNS: ED Triage Vitals  Enc Vitals Group     BP 06/06/20 1913 135/71     Pulse Rate 06/06/20 1913 97     Resp 06/06/20 1913 18     Temp 06/06/20 1913 98.4 F (36.9 C)     Temp Source 06/06/20 1913 Oral     SpO2 --      Weight --      Height --      Head Circumference --      Peak Flow --      Pain Score 06/06/20 1912 10     Pain Loc --      Pain Edu? --      Excl. in GC? --    Vitals:   06/06/20 1913  BP: 135/71  Pulse: 97  Resp: 18  Temp: 98.4 F (36.9 C)   Physical Exam Vitals and nursing note reviewed.  Constitutional:      Appearance: He is well-developed.  HENT:     Head: Normocephalic and atraumatic.     Right Ear: External ear normal.     Left Ear: External ear normal.     Nose: Nose normal.  Eyes:     Conjunctiva/sclera: Conjunctivae normal.  Cardiovascular:     Rate and Rhythm: Normal rate and regular rhythm.     Heart sounds: No murmur heard.   Pulmonary:     Effort: Pulmonary effort is normal. No respiratory distress.     Breath sounds: Normal breath sounds.  Abdominal:     Palpations: Abdomen is soft.     Tenderness: There is no abdominal tenderness.  Musculoskeletal:     Cervical back: Neck supple.  Skin:    General: Skin is warm and dry.     Capillary Refill: Capillary refill takes less than 2 seconds.  Neurological:     Mental Status: He is alert and oriented to person, place, and time.  Psychiatric:        Mood and Affect: Mood normal.     Linear hemostatic 3 cm laceration  over the distal aspect of the patient's left lower leg above the ankle.  Patient has full range of motion of the ankle.  2+ DP pulse.  Sensation is intact to light touch throughout the left lower extremity including over the foot.  Patient is able to move all toes.  There is no involvement of the ankle or knee.  No other lacerations or evidence of trauma to the left lower extremity.  ____________________________________________   PROCEDURES  Procedure(s) performed (including Critical Care):  Marland KitchenMarland KitchenLaceration Repair  Date/Time: 06/06/2020 7:28 PM Performed by: Gilles Chiquito, MD Authorized by: Gilles Chiquito, MD   Consent:    Consent obtained:  Verbal   Consent given by:  Patient   Risks discussed:  Pain, poor cosmetic result and need for additional repair   Alternatives discussed:  No treatment and observation Anesthesia (see MAR for exact dosages):    Anesthesia method:  Local infiltration   Local anesthetic:  Lidocaine 2% WITH epi Laceration details:    Location:  Leg   Leg location:  L lower leg   Length (cm):  3 Repair type:    Repair type:  Simple Exploration:    Hemostasis achieved with:  Direct pressure   Wound exploration: wound explored through full range of motion     Contaminated: no   Treatment:    Area cleansed with:  Saline   Amount of cleaning:  Standard   Irrigation solution:  Sterile saline   Irrigation volume:  100cc   Irrigation method:  Syringe   Visualized foreign bodies/material removed: no   Skin repair:    Repair method:  Sutures   Suture technique:  Simple interrupted   Number of sutures:  6 Approximation:    Approximation:  Close Post-procedure details:    Patient tolerance of procedure:  Tolerated well, no immediate complications     ____________________________________________   INITIAL IMPRESSION / ASSESSMENT AND PLAN / ED COURSE        Patient presents with above-stated history exam for assessment of laceration he sustained  earlier today as described above.  Afebrile hemodynamically stable.  No other evidence of other traumatic injuries or other life threats at this time.  Patient is neurovascularly intact distal to the wound I have low suspicion for occult underlying injury or fracture.  Patient was cut with a knife and have low suspicion for occult retained foreign body.  Tetanus updated.  Patient given below noted local analgesia as well as Tylenol.  Lack repaired per procedure note above.  Discharged stable condition.  Strict return precautions advised discussed.  Rx for short course of Keflex.  Instructed to follow-up with PCP in 7 to 10 days for suture removal and wound recheck.          ____________________________________________   FINAL CLINICAL IMPRESSION(S) / ED DIAGNOSES  Final diagnoses:  Laceration of left lower extremity, initial encounter     ED Discharge Orders         Ordered    cephALEXin (KEFLEX) 500 MG capsule  4 times daily     Discontinue  Reprint     06/06/20 1951           Note:  This document was prepared using Dragon voice recognition software and may include unintentional dictation errors.   Gilles Chiquito, MD 06/06/20 2004

## 2020-06-30 ENCOUNTER — Encounter: Payer: Self-pay | Admitting: Emergency Medicine

## 2020-06-30 ENCOUNTER — Other Ambulatory Visit: Payer: Self-pay

## 2020-06-30 ENCOUNTER — Emergency Department
Admission: EM | Admit: 2020-06-30 | Discharge: 2020-06-30 | Disposition: A | Discharge status: Home / Self Care | Payer: Medicaid Other | Attending: Emergency Medicine | Admitting: Emergency Medicine

## 2020-06-30 DIAGNOSIS — Z4802 Encounter for removal of sutures: Secondary | ICD-10-CM | POA: Diagnosis not present

## 2020-06-30 DIAGNOSIS — B9689 Other specified bacterial agents as the cause of diseases classified elsewhere: Secondary | ICD-10-CM | POA: Diagnosis not present

## 2020-06-30 DIAGNOSIS — F17210 Nicotine dependence, cigarettes, uncomplicated: Secondary | ICD-10-CM | POA: Insufficient documentation

## 2020-06-30 DIAGNOSIS — F1290 Cannabis use, unspecified, uncomplicated: Secondary | ICD-10-CM | POA: Diagnosis not present

## 2020-06-30 DIAGNOSIS — L089 Local infection of the skin and subcutaneous tissue, unspecified: Secondary | ICD-10-CM | POA: Insufficient documentation

## 2020-06-30 MED ORDER — SULFAMETHOXAZOLE-TRIMETHOPRIM 800-160 MG PO TABS: 1.0000 | ORAL_TABLET | Freq: Two times a day (BID) | ORAL | 0 refills | Status: DC

## 2020-06-30 NOTE — ED Provider Notes (Signed)
[facility] Emergency Department Provider Note   ____________________________________________   First MD Initiated Contact with Patient 06/30/20 1222     (approximate)  I have reviewed the triage vital signs and the nursing notes.   HISTORY  Chief Complaint Suture / Staple Removal    HPI Javier Glover is a 34 y.o. male patient presents for suture removal secondary to laceration of the left lower leg.  Sutures were placed on 06/06/2020.  Patient states  mild drainage.  Denies loss sensation or loss of function.         Past Medical History:  Diagnosis Date   Cirrhosis (HCC)    Seizures (HCC)    Childhood.  Medications for a while.  No meds or seizures for "a long time".    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   Tobacco use disorder 11/17/2018   Intellectual disability 11/17/2018   Suicidal behavior 11/16/2018   Cocaine use disorder, moderate, dependence (HCC) 11/16/2018   Alcohol use disorder, severe, dependence (HCC) 11/16/2018   Malingering 12/24/2017   Adult antisocial behavior 03/29/2016   Substance induced mood disorder (HCC) 03/29/2016   Suicidal ideation 03/25/2016    Past Surgical History:  Procedure Laterality Date   COLONOSCOPY WITH PROPOFOL N/A 01/03/2020   Procedure: COLONOSCOPY WITH BIOPSY;  Surgeon: Midge Minium, MD;  Location: [facility];  Service: Endoscopy;  Laterality: N/A;  PRIORITY 3   ESOPHAGOGASTRODUODENOSCOPY (EGD) WITH PROPOFOL N/A 01/03/2020   Procedure: ESOPHAGOGASTRODUODENOSCOPY (EGD) WITH PROPOFOL;  Surgeon: Midge Minium, MD;  Location:  [facility];  Service: Endoscopy;  Laterality: N/A;  PRIORITY 3   FRACTURE SURGERY      Prior to Admission medications   Medication Sig Start Date End Date Taking? Authorizing Provider  citalopram (CELEXA) 10 MG tablet Take 1 tablet (10 mg total) by mouth daily. 08/12/19   Clapacs, Jackquline Denmark, MD  metroNIDAZOLE (FLAGYL) 500 MG tablet Take 1 tablet (500 mg total) by mouth 3 (three) times daily. 01/05/20   Midge Minium, MD  omeprazole (PRILOSEC) 10 MG capsule Take 10 mg by mouth daily.    [provider]  ondansetron (ZOFRAN) 4 MG tablet Take 1 tablet (4 mg total) by mouth every 8 (eight) hours as needed for nausea, vomiting or refractory nausea / vomiting. 12/13/19   Miguel Aschoff., MD  sulfamethoxazole-trimethoprim (BACTRIM DS) 800-160 MG tablet Take 1 tablet by mouth 2 (two) times daily. 06/30/20   Joni Reining, PA-C    Allergies Ibuprofen  No family history on file.  Social History Social History   Tobacco Use   Smoking status: Current Every Day Smoker    Packs/day: 0.50    Years: 14.00    Pack years: 7.00    Types: Cigarettes   Smokeless tobacco: Never Used   Tobacco comment: 12/29/19 - currently smokes 1 pack/week  Vaping Use   Vaping Use: Former  Substance Use Topics   Alcohol use: Not Currently    Comment: occ   Drug use: Not Currently    Types: Cocaine    Comment: pt states none since before Christmas (2020)  Review of Systems Constitutional: No fever/chills Eyes: No visual changes. ENT: No sore throat. Cardiovascular: Denies chest pain. Respiratory: Denies shortness of breath. Gastrointestinal: No abdominal pain.  No nausea, no vomiting.  No diarrhea.  No constipation. Genitourinary: Negative for dysuria. Musculoskeletal: Negative for back pain. Skin: Negative for rash.  Erythema and drainage left lower extremity. Neurological: Negative for headaches, focal weakness or numbness. Psychiatric:  Alcohol use disorder, aggressive behavior, no  antisocial behavior.  Cocaine abuse and substance induced mood disorder and suicidal ideation. Allergic/Immunilogical: Ibuprofen ____________________________________________   PHYSICAL EXAM:  VITAL SIGNS: ED Triage Vitals  Enc Vitals Group     BP 06/30/20 1227 127/78     Pulse Rate 06/30/20 1227 78     Resp 06/30/20 1227 20     Temp 06/30/20 1227 98 F (36.7 C)     Temp Source 06/30/20 1227 Oral     SpO2 06/30/20 1227 98 %     Weight 06/30/20 1229 196 lb 3.4 oz (89 kg)     Height 06/30/20 1229 5\' 6"  (1.676 m)     Head Circumference --      Peak Flow --      Pain Score 06/30/20 1229 0     Pain Loc --      Pain Edu? --      Excl. in GC? --    Constitutional: Alert and oriented. Well appearing and in no acute distress. Cardiovascular: Normal rate, regular rhythm. Grossly normal heart sounds.  Good peripheral circulation. Respiratory: Normal respiratory effort.  No retractions. Lungs CTAB. Musculoskeletal: With mild edema and guarding with palpation. Neurologic:  Normal speech and language. No gross focal neurologic deficits are appreciated. No gait instability. Skin: Left lower leg with erythema and purulent drainage.   Psychiatric: Mood and affect are normal. Speech and behavior are normal.  ____________________________________________   LABS (all labs ordered are listed, but only abnormal results are displayed)  Labs Reviewed - No data to display ____________________________________________  EKG   ____________________________________________  RADIOLOGY  ED MD interpretation:    Official radiology report(s): No results found.  ____________________________________________   PROCEDURES  Procedure(s) performed (including Critical Care):  .Suture Removal  Date/Time: 06/30/2020 12:57 PM Performed by: 08/30/2020, PA-C Authorized by: Joni Reining, PA-C   Consent:    Consent obtained:  Verbal   Consent given by:  Patient   Risks discussed:  Bleeding,  pain and wound separation Location:    Location:  Lower extremity   Lower extremity location:  Leg   Leg location:  L lower leg Procedure details:    Wound appearance:  Draining, purulent, tender, red and warm   Drainage characteristics:  Purulent   Number of sutures removed:  6 Post-procedure details:    Post-removal:  Band-Aid applied   Patient tolerance of procedure:  Tolerated well, no immediate complications     ____________________________________________   INITIAL IMPRESSION / ASSESSMENT AND PLAN / ED COURSE  As part of my medical decision making, I reviewed the following data within the electronic MEDICAL RECORD NUMBER     Patient presents for suture removal status post 3 weeks from date of injury.  Patient has mild drainage but no wound separation.  See procedure note for suture removal.  Patient given discharge care instruction prescription for Bactrim DS.  Return to ED if condition worsens.    Javier Glover was evaluated in Emergency Department on 06/30/2020 for the symptoms described in the history of present illness. He was  evaluated in the context of the global COVID-19 pandemic, which necessitated consideration that the patient might be at risk for infection with the SARS-CoV-2 virus that causes COVID-19. Institutional protocols and algorithms that pertain to the evaluation of patients at risk for COVID-19 are in a state of rapid change based on information released by regulatory bodies including the CDC and federal and state organizations. These policies and algorithms were followed during the patient's care in the ED.       ____________________________________________   FINAL CLINICAL IMPRESSION(S) / ED DIAGNOSES  Final diagnoses:  Encounter for removal of sutures  Bacterial skin infection     ED Discharge Orders         Ordered    sulfamethoxazole-trimethoprim (BACTRIM DS) 800-160 MG tablet  2 times daily        06/30/20 1250           Note:  This  document was prepared using Dragon voice recognition software and may include unintentional dictation errors.    Joni Reining, PA-C 06/30/20 1258    Chesley Noon, MD 06/30/20 1438

## 2020-06-30 NOTE — Discharge Instructions (Addendum)
Follow discharge care instruction take medication as directed.  Clean wound with antibacterial soap twice a day.

## 2020-06-30 NOTE — ED Triage Notes (Signed)
States he is here for suture removal for leg  No redness or swelling

## 2020-06-30 NOTE — ED Notes (Signed)
Bandage removed and incision area wrapped with gauze per pt request.

## 2020-12-21 IMAGING — CT CT HEAD W/O CM
3 series · 15 of 47 positions shown, 18 images · non-contrast
Comparison: 03/27/2019

CLINICAL DATA: Nausea, vomiting, evaluate for intracranial
pathology.

EXAM:
CT HEAD WITHOUT CONTRAST
TECHNIQUE: Contiguous axial images were obtained from the base of the skull
through the vertex without intravenous contrast.

[Series 2: head wo · axial · 0.47mm/px · z∈[-159,-34]mm · 9 of 30 slices shown, 12 images]
[im 3/30  brain]
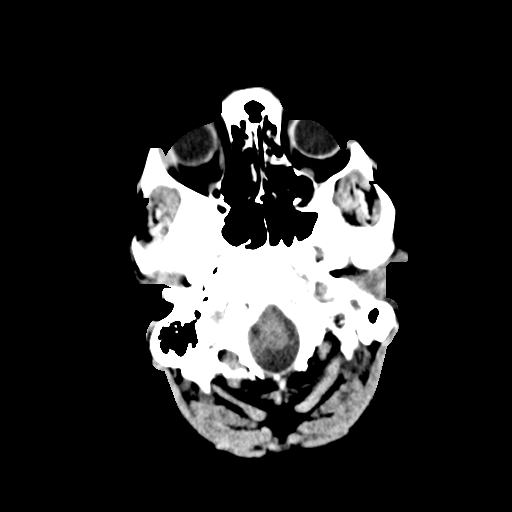
[im 3/30  bone]
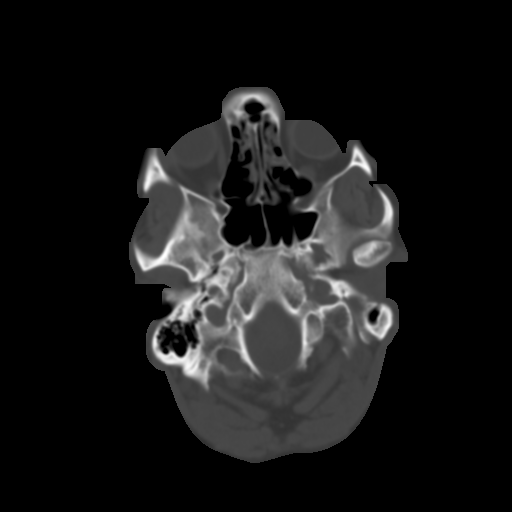
[im 6/30  brain]
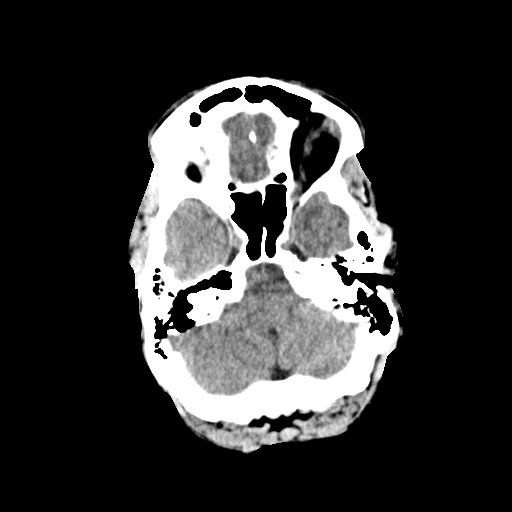
[im 9/30  brain]
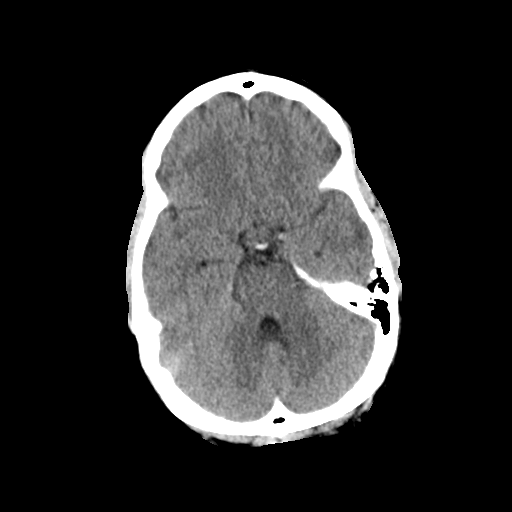
[im 12/30  brain]
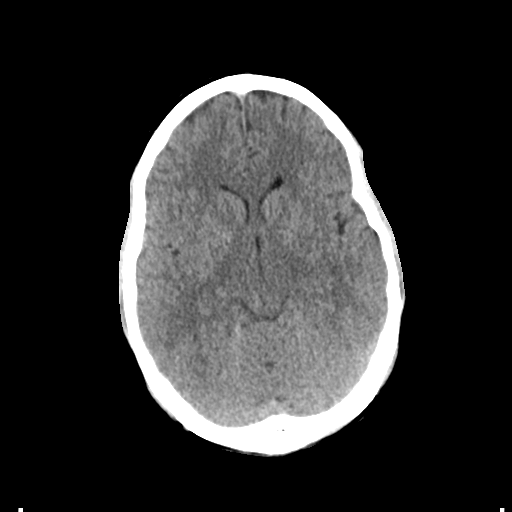
[im 16/30  brain]
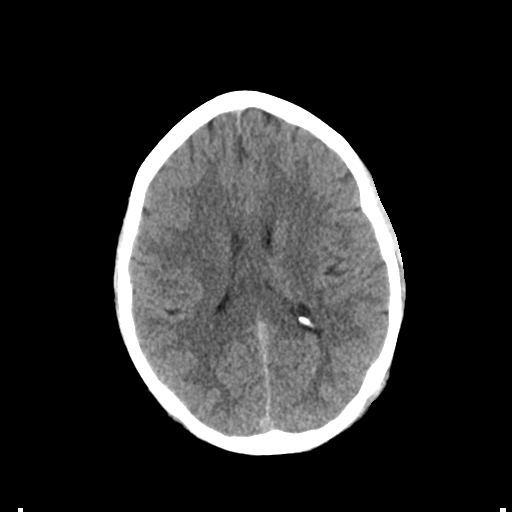
[im 16/30  bone]
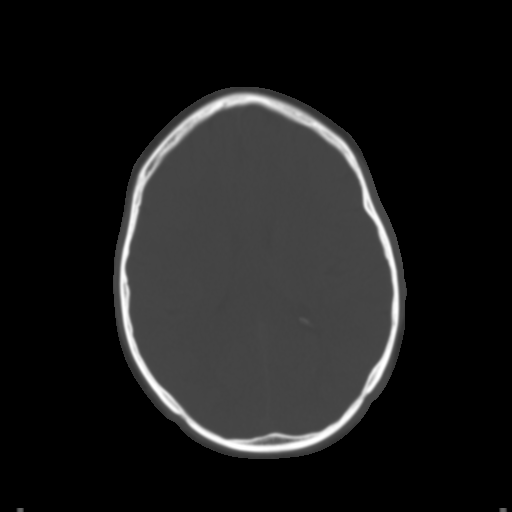
[im 19/30  brain]
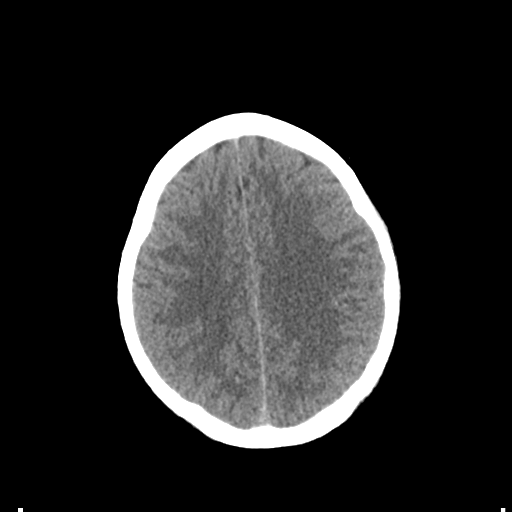
[im 22/30  brain]
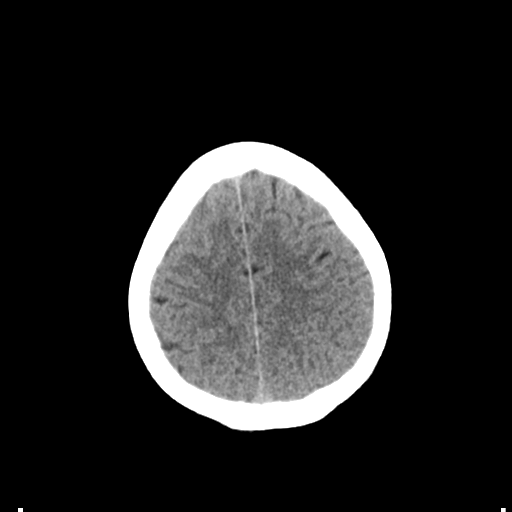
[im 25/30  brain]
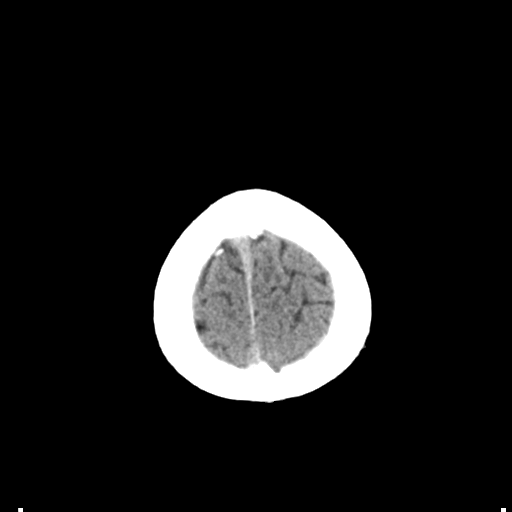
[im 28/30  brain]
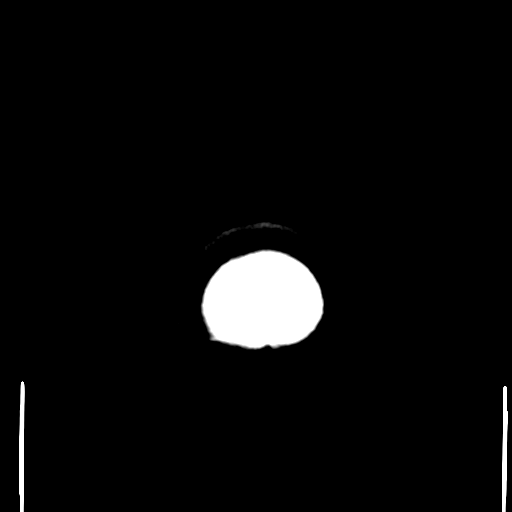
[im 28/30  bone]
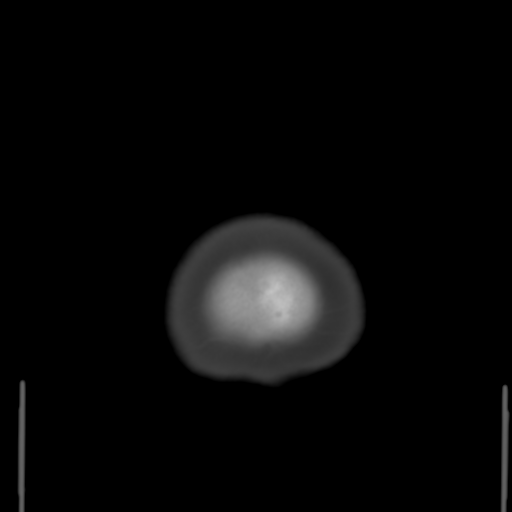

[Series 4: coronal soft tissue · coronal · 0.29mm/px · 3 of 60 slices shown]
[im 20/60  brain]
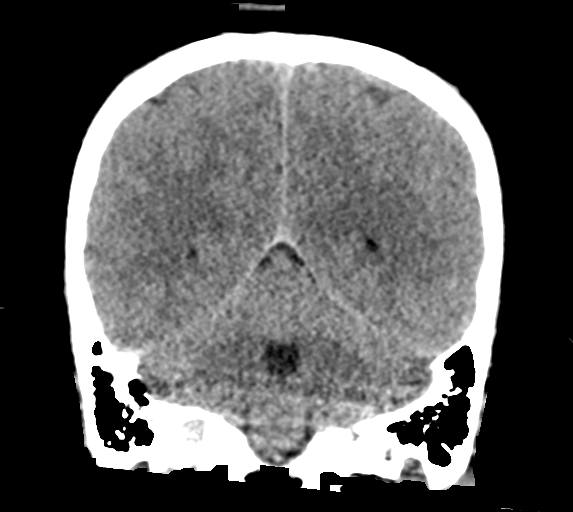
[im 27/60  brain]
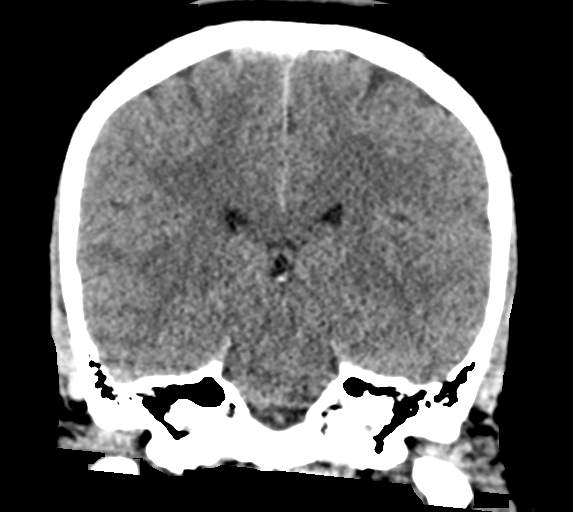
[im 33/60  brain]
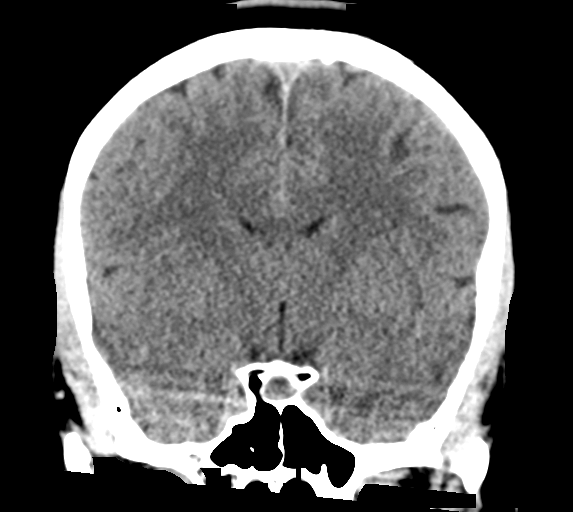

[Series 5: sagittal soft tissue · sagittal · 0.29mm/px · 3 of 50 slices shown]
[im 17/50  brain]
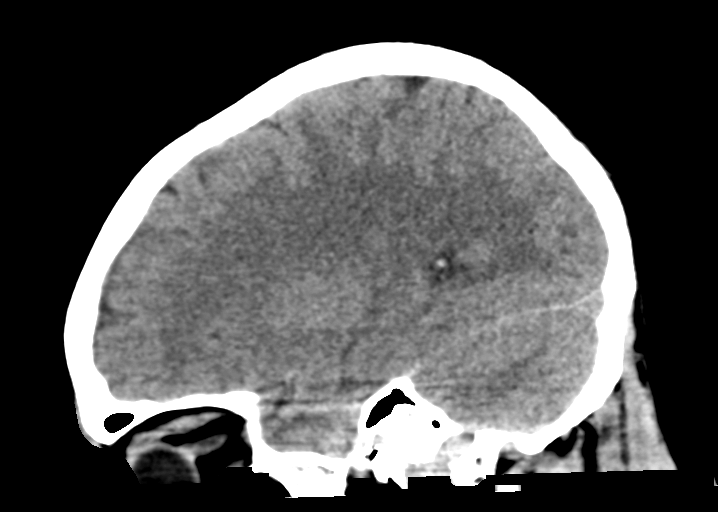
[im 25/50  brain]
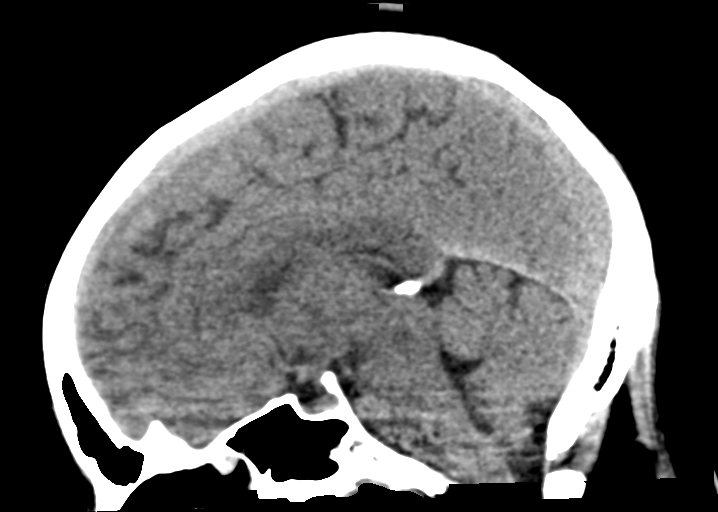
[im 33/50  brain]
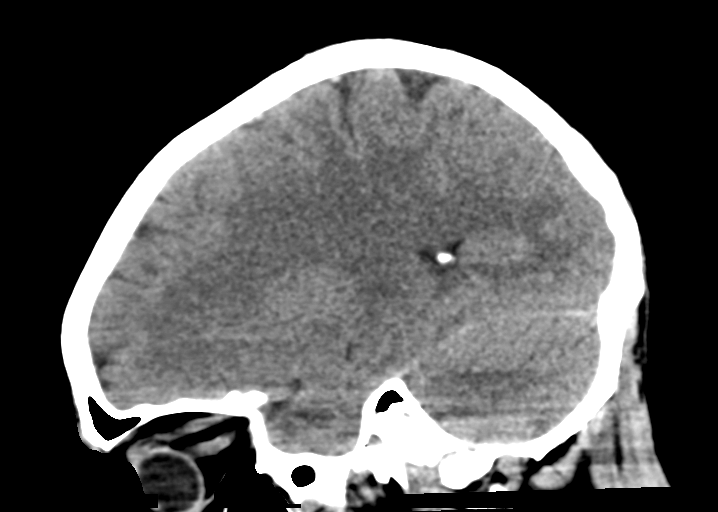

[15 of 47 positions shown; findings below may reference images not displayed]

FINDINGS: Brain: No evidence of acute infarction, hemorrhage, hydrocephalus,
extra-axial collection or mass lesion/mass effect.

Vascular: No hyperdense vessel or unexpected calcification.

Skull: Normal. Negative for fracture or focal lesion.

Sinuses/Orbits: No acute finding.

Other: None.
IMPRESSION: No acute intracranial abnormalities. Normal brain.

## 2021-09-03 ENCOUNTER — Other Ambulatory Visit: Payer: Self-pay

## 2021-09-03 ENCOUNTER — Ambulatory Visit: Payer: Medicaid Other | Admitting: Physician Assistant

## 2021-09-03 DIAGNOSIS — Z113 Encounter for screening for infections with a predominantly sexual mode of transmission: Secondary | ICD-10-CM

## 2021-09-03 DIAGNOSIS — A5401 Gonococcal cystitis and urethritis, unspecified: Secondary | ICD-10-CM

## 2021-09-03 LAB — GRAM STAIN

## 2021-09-03 MED ORDER — CEFTRIAXONE SODIUM 500 MG IJ SOLR
500.0000 mg | Freq: Once | INTRAMUSCULAR | Status: AC
  Administered 2021-09-03: 500 mg via INTRAMUSCULAR

## 2021-09-03 MED ORDER — DOXYCYCLINE HYCLATE 100 MG PO TABS: 100.0000 mg | ORAL_TABLET | Freq: Two times a day (BID) | ORAL | 0 refills | Status: AC

## 2021-09-03 NOTE — Progress Notes (Signed)
Patient here for STD testing. Gram stain reviewed, tx per standing orders. Condoms given.

## 2021-09-05 ENCOUNTER — Telehealth: Payer: Self-pay | Admitting: Family Medicine

## 2021-09-05 ENCOUNTER — Encounter: Payer: Self-pay | Admitting: Physician Assistant

## 2021-09-05 NOTE — Progress Notes (Signed)
[facility] Department STI clinic/screening visit  Subjective:  Javier Glover is a 35 y.o. male being seen today for an STI screening visit. The patient reports they do have symptoms.    Patient has the following medical conditions:   Patient Active Problem List   Diagnosis Date Noted   Blood in stool    Loss of weight    Bipolar 1 disorder ([place])    Aggressive behavior    Nausea and vomiting 06/10/2019   MDD (major depressive disorder), recurrent severe, without psychosis ([place]) 11/17/2018   Cannabis use disorder, moderate, dependence ([place]) 11/17/2018   Amphetamine use disorder, severe, dependence ([place]) 11/17/2018   Tobacco use disorder 11/17/2018   Intellectual disability 11/17/2018   Suicidal behavior 11/16/2018   Cocaine use disorder, moderate, dependence ([place]) 11/16/2018   Alcohol use disorder, severe, dependence ([place]) 11/16/2018   Malingering 12/24/2017   Adult antisocial behavior 03/29/2016   Substance induced mood disorder ([place]) 03/29/2016   Suicidal ideation 03/25/2016     Chief Complaint  Patient presents with   [place]    HPI  Patient reports that he has had brownish/bloody discharge from his penis for about 10 weeks.  Denies chronic conditions, surgeries and regular medicines.  Reports last HIV test was 2 years ago  and last void prior to sample collection for Gram stain was over 2 hr ago.   Screening for MPX risk: Does the patient have an unexplained rash? No Is the patient MSM? No Does the patient endorse multiple sex partners or anonymous sex partners? No Did the patient have close or sexual contact with a person diagnosed with MPX? No Has the patient traveled outside the [place] where MPX is endemic? No Is there a high clinical suspicion for MPX-- evidenced by one of the following No  -Unlikely to be chickenpox  -Lymphadenopathy  -Rash that present in same phase of evolution on any given body part   See flowsheet for  further details and programmatic requirements.    The following portions of the patient's history were reviewed and updated as appropriate: allergies, current medications, past medical history, past social history, past surgical history and problem list.  Objective:  There were no vitals filed for this visit.  Physical Exam Constitutional:      General: He is not in acute distress.    Appearance: Normal appearance.  HENT:     Head: Normocephalic and atraumatic.     Comments: No nits,lice, or hair loss. No cervical, supraclavicular or axillary adenopathy.     Mouth/Throat:     Mouth: Mucous membranes are moist.     Pharynx: Oropharynx is clear. No oropharyngeal exudate or posterior oropharyngeal erythema.  Eyes:     Conjunctiva/sclera: Conjunctivae normal.  Pulmonary:     Effort: Pulmonary effort is normal.  Abdominal:     Palpations: Abdomen is soft. There is no mass.     Tenderness: There is no abdominal tenderness. There is no guarding or rebound.  Genitourinary:    Penis: Normal.      Testes: Normal.     Comments: Pubic area without nits, lice, hair loss, edema, erythema, lesions and inguinal adenopathy. Penis circumcised without rash, or lesions. Small amount of yellowish discharge at meatus. Testicles descended bilaterally,nt, no masses or edema.  Musculoskeletal:     Cervical back: Neck supple. No tenderness.  Skin:    General: Skin is warm and dry.     Findings: No bruising, erythema, lesion or rash.  Neurological:     Mental Status: He is alert and oriented to person, place, and time.  Psychiatric:        Mood and Affect: Mood normal.        Behavior: Behavior normal.        Thought Content: Thought content normal.        Judgment: Judgment normal.      Assessment and Plan:  Javier Glover is a 35 y.o. male presenting to the [facility] Department for STI screening  1. Screening for STD (sexually transmitted disease) Patient into clinic with  symptoms. Reviewed Gram stain results. Rec condoms with all sex. Await test results.  Counseled that RN will call if needs to RTC for treatment once results are back.  - Gram stain  2. Gonococcal urethritis in male Treat for GC and cover for Chlamydia with Ceftriaxone 500 mg IM and with Doxycycline 100 mg #14 1 po BID for 7 days. No sex for 14 days and until after partner completes treatment. Call with questions or concerns.  - cefTRIAXone (ROCEPHIN) injection 500 mg - doxycycline (VIBRA-TABS) 100 MG tablet; Take 1 tablet (100 mg total) by mouth 2 (two) times daily for 7 days.  Dispense: 14 tablet; Refill: 0     No follow-ups on file.  No future appointments.  Matt Holmes, PA

## 2021-09-05 NOTE — Telephone Encounter (Signed)
Pt only has 7 pills left wants more pills, says he does not have money to pay for them.

## 2024-01-29 ENCOUNTER — Encounter: Payer: Self-pay | Admitting: Emergency Medicine

## 2024-01-29 ENCOUNTER — Other Ambulatory Visit: Payer: Self-pay

## 2024-01-29 ENCOUNTER — Emergency Department
Admission: EM | Admit: 2024-01-29 | Discharge: 2024-01-29 | Disposition: A | Discharge status: Home / Self Care | Payer: MEDICAID

## 2024-01-29 DIAGNOSIS — R112 Nausea with vomiting, unspecified: Secondary | ICD-10-CM | POA: Insufficient documentation

## 2024-01-29 DIAGNOSIS — L729 Follicular cyst of the skin and subcutaneous tissue, unspecified: Secondary | ICD-10-CM

## 2024-01-29 DIAGNOSIS — R2232 Localized swelling, mass and lump, left upper limb: Secondary | ICD-10-CM | POA: Diagnosis present

## 2024-01-29 DIAGNOSIS — L0211 Cutaneous abscess of neck: Secondary | ICD-10-CM | POA: Diagnosis not present

## 2024-01-29 LAB — COMPREHENSIVE METABOLIC PANEL WITH GFR
ALT: 14 U/L (ref 0–44)
AST: 17 U/L (ref 15–41)
Albumin: 4.1 g/dL (ref 3.5–5.0)
Alkaline Phosphatase: 44 U/L (ref 38–126)
Anion gap: 12 (ref 5–15)
BUN: 12 mg/dL (ref 6–20)
CO2: 21 mmol/L — ABNORMAL LOW (ref 22–32)
Calcium: 8.9 mg/dL (ref 8.9–10.3)
Chloride: 105 mmol/L (ref 98–111)
Creatinine, Ser: 0.65 mg/dL (ref 0.61–1.24)
GFR, Estimated: >60 mL/min (ref >60)
Glucose, Bld: 127 mg/dL — ABNORMAL HIGH (ref 70–99)
Potassium: 3.7 mmol/L (ref 3.5–5.1)
Sodium: 138 mmol/L (ref 135–145)
Total Bilirubin: 0.6 mg/dL (ref 0.0–1.2)
Total Protein: 7.3 g/dL (ref 6.5–8.1)

## 2024-01-29 LAB — CBC
HCT: 33.1 % — ABNORMAL LOW (ref 39.0–52.0)
Hemoglobin: 10.8 g/dL — ABNORMAL LOW (ref 13.0–17.0)
MCH: 26.4 pg (ref 26.0–34.0)
MCHC: 32.6 g/dL (ref 30.0–36.0)
MCV: 80.9 fL (ref 80.0–100.0)
Platelets: 287 10*3/uL (ref 150–400)
RBC: 4.09 MIL/uL — ABNORMAL LOW (ref 4.22–5.81)
RDW: 13.5 % (ref 11.5–15.5)
WBC: 6.7 10*3/uL (ref 4.0–10.5)
nRBC: 0 % (ref 0.0–0.2)

## 2024-01-29 LAB — LIPASE, BLOOD: Lipase: 48 U/L (ref 11–51)

## 2024-01-29 LAB — RESP PANEL BY RT-PCR (RSV, FLU A&B, COVID)  RVPGX2
Influenza A by PCR: NEGATIVE
Influenza B by PCR: NEGATIVE
Resp Syncytial Virus by PCR: NEGATIVE
SARS Coronavirus 2 by RT PCR: NEGATIVE

## 2024-01-29 MED ORDER — ONDANSETRON 4 MG PO TBDP
4.0000 mg | ORAL_TABLET | Freq: Once | ORAL | Status: AC
  Administered 2024-01-29: 4 mg via ORAL
  Filled 2024-01-29: qty 1

## 2024-01-29 MED ORDER — ONDANSETRON 4 MG PO TBDP: 4.0000 mg | ORAL_TABLET | Freq: Three times a day (TID) | ORAL | 0 refills | Status: DC | PRN

## 2024-01-29 NOTE — ED Triage Notes (Signed)
 Patient presents for abscess to left underarm and vomiting for "a long time" and is unable to hold his food down. Unable to tell RN how long he has been vomiting.   Pt also reports feeling hot/sweaty and a cough

## 2024-01-29 NOTE — ED Notes (Signed)
 See triage notes. Patient c/o vomiting for the past month. Patient also c/o abscess to left underarm.

## 2024-01-29 NOTE — Discharge Instructions (Addendum)
 Your evaluation in the emergency department was overall reassuring.  I am unsure as to the exact cause of your recurrent vomiting, but we saw no concerning findings today.  You did respond well to a nausea medication which I have also prescribed you--please take this as needed.  I do recommend that you follow-up with a primary care doctor for ongoing evaluation and management.  Return to the emergency department with any new or worsening symptoms.  The small bump in your left armpit appears to be a small cyst.  This is dangerous and does not require any intervention.

## 2024-01-29 NOTE — ED Provider Notes (Signed)
 [facility] Provider Note    Event Date/Time   First MD Initiated Contact with Patient 01/29/24 1246     (approximate)   History   Abscess and Emesis Patient presents for abscess to left underarm and vomiting for "a long time" and is unable to hold his food down. Unable to tell RN how long he has been vomiting.   Pt also reports feeling hot/sweaty and a cough    HPI  Javier Glover is a 38 y.o. male AUD, cirrhosis, MDD, polysubstance use, bipolar 1 disorder presents with multiple complaints but primarily complaining of recurrent nausea and vomiting as well as a small bump in his left armpit -Says he has been vomiting frequently after eating for about 2 months.  Denies any marijuana use.  No significant abdominal pain.  No fevers.  No night sweats. -Is still making stool -Separately notes that he had a small skin bump in his left armpit for about 2 months as well, mildly tender, no discharge.  Thinks he may have been bit by a spider there previously.       Physical Exam   Triage Vital Signs: ED Triage Vitals  Encounter Vitals Group     BP 01/29/24 1125 (!) 134/90     Systolic BP Percentile --      Diastolic BP Percentile --      Pulse Rate 01/29/24 1125 86     Resp 01/29/24 1125 18     Temp --      Temp Source 01/29/24 1125 Oral     SpO2 01/29/24 1125 98 %     Weight 01/29/24 1126 150 lb (68 kg)     Height 01/29/24 1126 5\' 6"  (1.676 m)     Head Circumference --      Peak Flow --      Pain Score 01/29/24 1126 0     Pain Loc --      Pain Education --      Exclude from Growth Chart --     Most recent vital signs: Vitals:   01/29/24 1125  BP: (!) 134/90  Pulse: 86  Resp: 18  SpO2: 98%     General: Awake, no distress.  CV:  Good peripheral perfusion.  Regular rate and rhythm, RP 2+. Resp:  Normal effort.  CTAB. Abd:  No distention.  No tenderness to deep palpation throughout. LUE:   Small somewhat firm mass (1 cm x 1.5 cm) in left  axillary region.  No purulence.  Bedside ultrasound with small fluid pocket.   ED Results / Procedures / Treatments   Labs (all labs ordered are listed, but only abnormal results are displayed) Labs Reviewed  COMPREHENSIVE METABOLIC PANEL WITH GFR - Abnormal; Notable for the following components:      Result Value   CO2 21 (*)    Glucose, Bld 127 (*)    All other components within normal limits  CBC - Abnormal; Notable for the following components:   RBC 4.09 (*)    Hemoglobin 10.8 (*)    HCT 33.1 (*)    All other components within normal limits  RESP PANEL BY RT-PCR (RSV, FLU A&B, COVID)  RVPGX2  LIPASE, BLOOD  URINALYSIS, ROUTINE W REFLEX MICROSCOPIC     EKG  N/a   RADIOLOGY N/a    PROCEDURES:  Critical Care performed: No  Procedures   MEDICATIONS ORDERED IN ED: Medications  ondansetron (ZOFRAN-ODT) disintegrating tablet 4 mg (4 mg Oral Given 01/29/24  1332)     IMPRESSION / MDM / ASSESSMENT AND PLAN / ED COURSE  I reviewed the triage vital signs and the nursing notes.                              Differential diagnosis includes, but is not limited to, gastritis, gastroparesis, doubt obstruction given patient is stooling.  Left axilla cutaneous mass does not appear grossly infected, no surrounding cellulitis though consider possibility of cyst versus abscess given small fluid pocket visualized on ultrasound.  Will perform bedside I&D with needle.  Patient's presentation is most consistent with acute complicated illness / injury requiring diagnostic workup.  ED course below.  Workup overall very reassuring--Labs with no significant evidence of dehydration, hepatobiliary labs normal, no significant CBC abnormalities.  Patient tolerating applesauce, juice, crackers after single dose of Zofran without any difficulty.  Needle aspiration of left axilla mass with serous fluid most consistent with small cyst.  Counseled patient importance of following up with PMD, can  consider outpatient GI referral as needed if persistent symptoms despite Rx of Zofran.  ED return precautions in place.  Patient agrees with plan.  Clinical Course as of 01/29/24 1602  Thu Jan 29, 2024  1321 CBC with no leukocytosis, mild anemia.  Viral swab negative  CMP reviewed, overall unremarkable.  Hepatobiliary labs normal.   [MM]    Clinical Course User Index [MM] Marinell Blight, MD     FINAL CLINICAL IMPRESSION(S) / ED DIAGNOSES   Final diagnoses:  Cutaneous cyst  Nausea and vomiting, unspecified vomiting type     Rx / DC Orders   ED Discharge Orders          Ordered    ondansetron (ZOFRAN-ODT) 4 MG disintegrating tablet  Every 8 hours PRN        01/29/24 1447             Note:  This document was prepared using Dragon voice recognition software and may include unintentional dictation errors.   Marinell Blight, MD 01/29/24 [phone]
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
## 2024-04-12 NOTE — ED Notes (Signed)
 Patient discharged in stable condition, A&Ox4, ambulatory with steady gait. Attempted to give discharge instructions, pt said throw that sh*t away. Gave verbal instructions to follow up with their established PCP. Pt refused to sign DC paperwork.  Belongings given back to patient, security facilitated discharge.

## 2024-04-12 NOTE — ED Notes (Signed)
 Called security for property return.

## 2024-10-06 ENCOUNTER — Emergency Department (HOSPITAL_COMMUNITY)
Admission: EM | Admit: 2024-10-06 | Discharge: 2024-10-06 | Disposition: A | Discharge status: Other Acute Inpatient Hospital | Payer: MEDICAID | Attending: Emergency Medicine | Admitting: Emergency Medicine

## 2024-10-06 ENCOUNTER — Inpatient Hospital Stay (HOSPITAL_COMMUNITY)
Admission: AD | Admit: 2024-10-06 | Discharge: 2024-10-14 | DRG: 951 | Disposition: A | Payer: MEDICAID | Source: Intra-hospital | Admitting: Behavioral Health

## 2024-10-06 ENCOUNTER — Encounter (HOSPITAL_COMMUNITY): Payer: Self-pay | Admitting: Behavioral Health

## 2024-10-06 ENCOUNTER — Encounter (HOSPITAL_COMMUNITY): Payer: Self-pay | Admitting: Emergency Medicine

## 2024-10-06 ENCOUNTER — Other Ambulatory Visit: Payer: Self-pay

## 2024-10-06 DIAGNOSIS — Z5901 Sheltered homelessness: Secondary | ICD-10-CM

## 2024-10-06 DIAGNOSIS — F1510 Other stimulant abuse, uncomplicated: Secondary | ICD-10-CM | POA: Diagnosis present

## 2024-10-06 DIAGNOSIS — Z5900 Homelessness unspecified: Secondary | ICD-10-CM | POA: Diagnosis not present

## 2024-10-06 DIAGNOSIS — Z635 Disruption of family by separation and divorce: Secondary | ICD-10-CM

## 2024-10-06 DIAGNOSIS — Z720 Tobacco use: Secondary | ICD-10-CM | POA: Diagnosis not present

## 2024-10-06 DIAGNOSIS — F39 Unspecified mood [affective] disorder: Secondary | ICD-10-CM | POA: Diagnosis present

## 2024-10-06 DIAGNOSIS — Z765 Malingerer [conscious simulation]: Principal | ICD-10-CM

## 2024-10-06 DIAGNOSIS — K219 Gastro-esophageal reflux disease without esophagitis: Secondary | ICD-10-CM | POA: Diagnosis present

## 2024-10-06 DIAGNOSIS — F17213 Nicotine dependence, cigarettes, with withdrawal: Secondary | ICD-10-CM | POA: Diagnosis present

## 2024-10-06 DIAGNOSIS — F840 Autistic disorder: Secondary | ICD-10-CM | POA: Diagnosis not present

## 2024-10-06 DIAGNOSIS — R45851 Suicidal ideations: Secondary | ICD-10-CM

## 2024-10-06 DIAGNOSIS — F329 Major depressive disorder, single episode, unspecified: Secondary | ICD-10-CM | POA: Diagnosis not present

## 2024-10-06 DIAGNOSIS — F1590 Other stimulant use, unspecified, uncomplicated: Secondary | ICD-10-CM | POA: Diagnosis not present

## 2024-10-06 DIAGNOSIS — K0889 Other specified disorders of teeth and supporting structures: Secondary | ICD-10-CM | POA: Diagnosis present

## 2024-10-06 DIAGNOSIS — Z79899 Other long term (current) drug therapy: Secondary | ICD-10-CM | POA: Diagnosis not present

## 2024-10-06 DIAGNOSIS — F602 Antisocial personality disorder: Secondary | ICD-10-CM | POA: Diagnosis present

## 2024-10-06 DIAGNOSIS — F319 Bipolar disorder, unspecified: Secondary | ICD-10-CM | POA: Diagnosis present

## 2024-10-06 DIAGNOSIS — F332 Major depressive disorder, recurrent severe without psychotic features: Secondary | ICD-10-CM | POA: Diagnosis present

## 2024-10-06 DIAGNOSIS — F209 Schizophrenia, unspecified: Secondary | ICD-10-CM | POA: Diagnosis present

## 2024-10-06 LAB — COMPREHENSIVE METABOLIC PANEL WITH GFR
ALT: 22 U/L (ref 0–44)
AST: 19 U/L (ref 15–41)
Albumin: 4.9 g/dL (ref 3.5–5.0)
Alkaline Phosphatase: 61 U/L (ref 38–126)
Anion gap: 13 (ref 5–15)
BUN: 17 mg/dL (ref 6–20)
CO2: 25 mmol/L (ref 22–32)
Calcium: 9.4 mg/dL (ref 8.9–10.3)
Chloride: 102 mmol/L (ref 98–111)
Creatinine, Ser: 0.68 mg/dL (ref 0.61–1.24)
GFR, Estimated: >60 mL/min (ref >60)
Glucose, Bld: 105 mg/dL — ABNORMAL HIGH (ref 70–99)
Potassium: 3.7 mmol/L (ref 3.5–5.1)
Sodium: 141 mmol/L (ref 135–145)
Total Bilirubin: 0.6 mg/dL (ref 0.0–1.2)
Total Protein: 8.2 g/dL — ABNORMAL HIGH (ref 6.5–8.1)

## 2024-10-06 LAB — CBC
HCT: 34.8 % — ABNORMAL LOW (ref 39.0–52.0)
Hemoglobin: 11.4 g/dL — ABNORMAL LOW (ref 13.0–17.0)
MCH: 26.8 pg (ref 26.0–34.0)
MCHC: 32.8 g/dL (ref 30.0–36.0)
MCV: 81.7 fL (ref 80.0–100.0)
Platelets: 276 K/uL (ref 150–400)
RBC: 4.26 MIL/uL (ref 4.22–5.81)
RDW: 13.7 % (ref 11.5–15.5)
WBC: 8.9 K/uL (ref 4.0–10.5)
nRBC: 0 % (ref 0.0–0.2)

## 2024-10-06 LAB — URINE DRUG SCREEN
Amphetamines: NEGATIVE
Barbiturates: NEGATIVE
Benzodiazepines: NEGATIVE
Cocaine: POSITIVE — AB
Fentanyl: NEGATIVE
Methadone Scn, Ur: NEGATIVE
Opiates: NEGATIVE
Tetrahydrocannabinol: NEGATIVE

## 2024-10-06 LAB — ACETAMINOPHEN LEVEL: Acetaminophen (Tylenol), Serum: <10 ug/mL — ABNORMAL LOW (ref 10–30)

## 2024-10-06 LAB — ETHANOL: Alcohol, Ethyl (B): <15 mg/dL (ref <15)

## 2024-10-06 LAB — SALICYLATE LEVEL: Salicylate Lvl: <7 mg/dL — ABNORMAL LOW (ref 7.0–30.0)

## 2024-10-06 MED ORDER — LORAZEPAM 2 MG/ML IJ SOLN: 2.0000 mg | Freq: Three times a day (TID) | INTRAMUSCULAR | Status: DC | PRN

## 2024-10-06 MED ORDER — MAGNESIUM HYDROXIDE 400 MG/5ML PO SUSP: 30.0000 mL | Freq: Every day | ORAL | Status: DC | PRN

## 2024-10-06 MED ORDER — HYDROXYZINE HCL 25 MG PO TABS
25.0000 mg | ORAL_TABLET | Freq: Four times a day (QID) | ORAL | Status: DC | PRN
  Administered 2024-10-07 – 2024-10-08 (×2): 25 mg via ORAL

## 2024-10-06 MED ORDER — HALOPERIDOL LACTATE 5 MG/ML IJ SOLN: 10.0000 mg | Freq: Three times a day (TID) | INTRAMUSCULAR | Status: DC | PRN

## 2024-10-06 MED ORDER — ACETAMINOPHEN 325 MG PO TABS
650.0000 mg | ORAL_TABLET | Freq: Four times a day (QID) | ORAL | Status: DC | PRN
  Administered 2024-10-11 – 2024-10-13 (×6): 650 mg via ORAL
  Filled 2024-10-06 (×6): qty 2

## 2024-10-06 MED ORDER — VITAMIN B-1 100 MG PO TABS
100.0000 mg | ORAL_TABLET | Freq: Every day | ORAL | Status: DC
  Administered 2024-10-07 – 2024-10-14 (×8): 100 mg via ORAL
  Filled 2024-10-06 (×8): qty 1

## 2024-10-06 MED ORDER — ADULT MULTIVITAMIN W/MINERALS CH
1.0000 | ORAL_TABLET | Freq: Every day | ORAL | Status: DC
  Administered 2024-10-07 – 2024-10-14 (×8): 1 via ORAL
  Filled 2024-10-06 (×8): qty 1

## 2024-10-06 MED ORDER — SERTRALINE HCL 50 MG PO TABS
25.0000 mg | ORAL_TABLET | Freq: Every day | ORAL | Status: DC
  Administered 2024-10-06: 25 mg via ORAL
  Filled 2024-10-06: qty 1

## 2024-10-06 MED ORDER — DIPHENHYDRAMINE HCL 50 MG/ML IJ SOLN: 50.0000 mg | Freq: Three times a day (TID) | INTRAMUSCULAR | Status: DC | PRN

## 2024-10-06 MED ORDER — TRAZODONE HCL 50 MG PO TABS
50.0000 mg | ORAL_TABLET | Freq: Every evening | ORAL | Status: DC | PRN
  Administered 2024-10-06 – 2024-10-13 (×8): 50 mg via ORAL
  Filled 2024-10-06 (×8): qty 1

## 2024-10-06 MED ORDER — LOPERAMIDE HCL 2 MG PO CAPS: 2.0000 mg | ORAL_CAPSULE | ORAL | Status: AC | PRN

## 2024-10-06 MED ORDER — ONDANSETRON 4 MG PO TBDP: 4.0000 mg | ORAL_TABLET | Freq: Four times a day (QID) | ORAL | Status: AC | PRN

## 2024-10-06 MED ORDER — LORAZEPAM 1 MG PO TABS: 0.0000 mg | ORAL_TABLET | Freq: Two times a day (BID) | ORAL | Status: DC

## 2024-10-06 MED ORDER — HALOPERIDOL 5 MG PO TABS
5.0000 mg | ORAL_TABLET | Freq: Three times a day (TID) | ORAL | Status: DC | PRN
  Administered 2024-10-09: 5 mg via ORAL
  Filled 2024-10-06: qty 1

## 2024-10-06 MED ORDER — HYDROXYZINE HCL 25 MG PO TABS
25.0000 mg | ORAL_TABLET | Freq: Three times a day (TID) | ORAL | Status: DC | PRN
  Administered 2024-10-06 – 2024-10-07 (×2): 25 mg via ORAL
  Filled 2024-10-06 (×4): qty 1

## 2024-10-06 MED ORDER — LORAZEPAM 1 MG PO TABS: 0.0000 mg | ORAL_TABLET | Freq: Four times a day (QID) | ORAL | Status: DC

## 2024-10-06 MED ORDER — HALOPERIDOL LACTATE 5 MG/ML IJ SOLN: 5.0000 mg | Freq: Three times a day (TID) | INTRAMUSCULAR | Status: DC | PRN

## 2024-10-06 MED ORDER — LORAZEPAM 2 MG/ML IJ SOLN: 0.0000 mg | Freq: Four times a day (QID) | INTRAMUSCULAR | Status: DC

## 2024-10-06 MED ORDER — ALUM & MAG HYDROXIDE-SIMETH 200-200-20 MG/5ML PO SUSP
30.0000 mL | ORAL | Status: DC | PRN
  Administered 2024-10-10 – 2024-10-11 (×5): 30 mL via ORAL
  Filled 2024-10-06 (×5): qty 30

## 2024-10-06 MED ORDER — THIAMINE HCL 100 MG/ML IJ SOLN: 100.0000 mg | Freq: Every day | INTRAMUSCULAR | Status: DC

## 2024-10-06 MED ORDER — SERTRALINE HCL 25 MG PO TABS
25.0000 mg | ORAL_TABLET | Freq: Every day | ORAL | Status: DC
  Administered 2024-10-07: 25 mg via ORAL
  Filled 2024-10-06: qty 1

## 2024-10-06 MED ORDER — LORAZEPAM 2 MG/ML IJ SOLN: 0.0000 mg | Freq: Two times a day (BID) | INTRAMUSCULAR | Status: DC

## 2024-10-06 MED ORDER — DIPHENHYDRAMINE HCL 25 MG PO CAPS
50.0000 mg | ORAL_CAPSULE | Freq: Three times a day (TID) | ORAL | Status: DC | PRN
  Administered 2024-10-09: 50 mg via ORAL
  Filled 2024-10-06: qty 2

## 2024-10-06 MED ORDER — LORAZEPAM 1 MG PO TABS
1.0000 mg | ORAL_TABLET | Freq: Four times a day (QID) | ORAL | Status: AC | PRN
  Administered 2024-10-09: 1 mg via ORAL
  Filled 2024-10-06: qty 1

## 2024-10-06 MED ORDER — THIAMINE MONONITRATE 100 MG PO TABS
100.0000 mg | ORAL_TABLET | Freq: Every day | ORAL | Status: DC
  Administered 2024-10-06: 100 mg via ORAL
  Filled 2024-10-06: qty 1

## 2024-10-06 NOTE — ED Notes (Signed)
 [facility] called to transport pt to [facility] ETA 10 min.

## 2024-10-06 NOTE — ED Provider Notes (Signed)
 Care assumed from Ubaldo High, PA-C at shift change pending medical clearance.  See his note for full HPI.  In short patient is a 38 year old male who presents to the ED due to suicidal ideations.  Patient notes he has thoughts about killing himself by cutting his wrists.  Has not taken his psychiatric medication for years. Physical Exam  BP 122/80   Pulse 80   Temp 98 F (36.7 C)   Resp 16   SpO2 100%   Physical Exam Vitals and nursing note reviewed.  Constitutional:      General: He is not in acute distress.    Appearance: He is not ill-appearing.  HENT:     Head: Normocephalic.  Eyes:     Pupils: Pupils are equal, round, and reactive to light.  Cardiovascular:     Rate and Rhythm: Normal rate and regular rhythm.     Pulses: Normal pulses.     Heart sounds: Normal heart sounds. No murmur heard.    No friction rub. No gallop.  Pulmonary:     Effort: Pulmonary effort is normal.     Breath sounds: Normal breath sounds.  Abdominal:     General: Abdomen is flat. There is no distension.     Palpations: Abdomen is soft.     Tenderness: There is no abdominal tenderness. There is no guarding or rebound.  Musculoskeletal:        General: Normal range of motion.     Cervical back: Neck supple.  Skin:    General: Skin is warm and dry.  Neurological:     General: No focal deficit present.     Mental Status: He is alert.  Psychiatric:        Mood and Affect: Mood normal.        Behavior: Behavior normal.     Procedures  Procedures  ED Course / MDM    Medical Decision Making Amount and/or Complexity of Data Reviewed Labs: ordered. Decision-making details documented in ED Course.   38 year old male presents to the ED due to suicidal ideations. Has not been on psychiatric medication in years.   Reassessed patient.  Patient resting comfortably in bed.  CBC with no leukocytosis.  Hemoglobin 11.4.  CMP reassuring.  Normal renal function.  No major electrolyte  derangements.  Acetaminophen , salicylate, and ethanol levels within normal limits.  Patient has been medically cleared for TTS evaluation. UDS pending. No home medications ordered since patient has not been on medications in years (appears he has been on Celexa  in the past). CIWA initiated. History of alcohol abuse  The patient has been placed in psychiatric observation due to the need to provide a safe environment for the patient while obtaining psychiatric consultation and evaluation, as well as ongoing medical and medication management to treat the patient's condition.  The patient has not been placed under full IVC at this time.  Co morbidities that complicate the patient evaluation  Bipolar disorder  Social Determinants of Health:  History of polysubstance abuse  Test / Admission - Considered:  Disposition pending TTS       Lorelle Aleck BROCKS, PA-C 10/06/24 9182    Jerrol Agent, MD 10/07/24 [phone]

## 2024-10-06 NOTE — ED Notes (Signed)
 Attempted to call report to [facility] for this pt, staff stated that they would not take report until night shift.

## 2024-10-06 NOTE — Tx Team (Signed)
 Initial Treatment Plan 10/06/2024 10:56 PM VASILIOS OTTAWAY FMW:[id number]    PATIENT STRESSORS: Financial difficulties   Medication change or noncompliance   Substance abuse     PATIENT STRENGTHS: General fund of knowledge  Motivation for treatment/growth  Supportive family/friends    PATIENT IDENTIFIED PROBLEMS: SI  SA-Cocaine  Depression  Housing    Nothing right now           DISCHARGE CRITERIA:  Ability to meet basic life and health needs Improved stabilization in mood, thinking, and/or behavior Verbal commitment to aftercare and medication compliance  PRELIMINARY DISCHARGE PLAN: Attend aftercare/continuing care group Placement in alternative living arrangements Return to previous living arrangement  PATIENT/FAMILY INVOLVEMENT: This treatment plan has been presented to and reviewed with the patient, Ndrew A Isidore.  The patient and family have been given the opportunity to ask questions and make suggestions.  Rosina LOISE Pyo, RN 10/06/2024, 10:56 PM

## 2024-10-06 NOTE — ED Notes (Signed)
 Pt had cranberry juice and water  w/ breakfast sandwich.

## 2024-10-06 NOTE — ED Notes (Signed)
Pt made phone call. 

## 2024-10-06 NOTE — ED Notes (Signed)
 Pt wanded by security.

## 2024-10-06 NOTE — ED Provider Notes (Signed)
 Javier Glover EMERGENCY DEPARTMENT AT [facility] Provider Note   CSN: [id number] Arrival date & time: 10/06/24  9483     Patient presents with: Suicidal   Javier Glover is a 38 y.o. male.  Patient with past medical history significant for alcohol use disorder, cocaine use disorder, major depressive disorder, cannabis use disorder, amphetamine use disorder, tobacco use disorder, adult antisocial behavior, bipolar 1 disorder, suicidal ideation, malingering, homelessness presents to the emergency department via law enforcement.  Patient states that he was to kill himself by cutting his wrists.  He says that he has not taken psychiatric medication for years.  He does endorse using some illicit drugs approximately 1 week ago.  He endorses drinking 2 bottles of beer earlier this evening.  He denies hallucinations or homicidal ideations.  He denies any physical complaints at this time.  I asked the patient if there were any particular triggers or making him feel suicidal this evening he states that family stuff and I have got a lot of stuff going on.  He is requesting food and drink   HPI     Prior to Admission medications   Medication Sig Start Date End Date Taking? Authorizing Provider  citalopram  (CELEXA ) 10 MG tablet Take 1 tablet (10 mg total) by mouth daily. Patient not taking: Reported on 09/03/2021 08/12/19   Javier Glover, Javier DASEN, MD  metroNIDAZOLE  (FLAGYL ) 500 MG tablet Take 1 tablet (500 mg total) by mouth 3 (three) times daily. Patient not taking: Reported on 09/03/2021 01/05/20   Javier Carmine, MD  omeprazole  (PRILOSEC) 10 MG capsule Take 10 mg by mouth daily. Patient not taking: Reported on 09/03/2021    [provider]  ondansetron  (ZOFRAN ) 4 MG tablet Take 1 tablet (4 mg total) by mouth every 8 (eight) hours as needed for nausea, vomiting or refractory nausea / vomiting. Patient not taking: Reported on 09/03/2021 12/13/19   Javier Glover., MD  ondansetron  (ZOFRAN -ODT)  4 MG disintegrating tablet Take 1 tablet (4 mg total) by mouth every 8 (eight) hours as needed for nausea or vomiting. 01/29/24   Javier Ozell DELENA, MD  sulfamethoxazole -trimethoprim  (BACTRIM  DS) 800-160 MG tablet Take 1 tablet by mouth 2 (two) times daily. Patient not taking: Reported on 09/03/2021 06/30/20   Javier Tanda POUR, PA-C    Allergies: Ibuprofen    Review of Systems  Updated Vital Signs BP 122/80   Pulse 80   Temp 98 F (36.7 C)   Resp 16   SpO2 100%   Physical Exam Vitals and nursing note reviewed.  HENT:     Head: Normocephalic and atraumatic.  Eyes:     Pupils: Pupils are equal, round, and reactive to light.  Pulmonary:     Effort: Pulmonary effort is normal. No respiratory distress.  Musculoskeletal:        General: No signs of injury.     Cervical back: Normal range of motion.  Skin:    General: Skin is dry.  Neurological:     Mental Status: He is alert.  Psychiatric:        Speech: Speech normal.     Comments: Patient endorsing suicidal ideation     (all labs ordered are listed, but only abnormal results are displayed) Labs Reviewed  CBC - Abnormal; Notable for the following components:      Result Value   Hemoglobin 11.4 (*)    HCT 34.8 (*)    All other components within normal limits  COMPREHENSIVE METABOLIC PANEL  WITH GFR  ETHANOL  URINE DRUG SCREEN    EKG: None  Radiology: No results found.   Procedures   Medications Ordered in the ED - No data to display                                  Medical Decision Making Amount and/or Complexity of Data Reviewed Labs: ordered.   This patient presents to the ED for concern of suicidal ideation, this involves an extensive number of treatment options, and is a complaint that carries with it a high risk of complications and morbidity.     Co morbidities / Chronic conditions that complicate the patient evaluation  As noted in HPI   Additional history obtained:  Additional history obtained  from EMR External records from outside source obtained and reviewed including previous evaluations at outside emergency departments for similar complaints.  Patient has been hospitalized in psychiatric units before but has also been discharged with outpatient resources.  Patient apparently does not follow through with outpatient follow-up once discharged from psychiatric hospitalizations.   Lab Tests:  I Ordered, and personally interpreted labs.  The pertinent results include:  unremarkable CBC.     Consultations Obtained:  I requested consultation with the behavioral health team   Social Determinants of Health:  Patient is homeless   Test / Admission - Considered:  Patient is here voluntarily for psychiatric evaluation. Patient will need TTS evaluation after being medically cleared. Patient care transferred to [facility] [place]      Final diagnoses:  Suicidal ideation    ED Discharge Orders     None          Javier Glover 10/06/24 9373    Palumbo, April, MD 10/06/24 [phone]

## 2024-10-06 NOTE — Progress Notes (Signed)
 Patient ID: Javier Glover, male   DOB: 04/19/1986, 38 y.o.   MRN: [id number]  38 yr male who presents VOL in no acute distress for the treatment of SI and Depression, with a plan to cut wrist.  Pt stated he was a little sad and had a flat affect.  Pt was calm and cooperative with admission process. Pt presents with passive SI with no plan and contracts for safety upon admission. Pt denies AVH. Pt has history of substance abuse drug screen was positive for cocaine and pt is living in a shelter at the moment. Skin was assessed and found to be clear of any abnormal marks. PT searched and no contraband found, POC and unit policies explained and understanding verbalized. Consents obtained. Food and fluids offered, and  accepted. Pt had no additional questions or concerns.

## 2024-10-06 NOTE — ED Triage Notes (Addendum)
 Patient c/o SI with plan on cutting his wrist. Patient states his not taking his medication for years. Patient report drug use a week ago. Patient report 2 bottles of beers tonight. Patient denies HI.

## 2024-10-06 NOTE — ED Notes (Signed)
 Patient blood work done, Change to purple scrubs, and transferred to hall B. Patient belonging bag in nurse station.

## 2024-10-06 NOTE — ED Notes (Signed)
 3 bags belongings in locker 29 tcu

## 2024-10-06 NOTE — Progress Notes (Signed)
 Pt has been accepted to [facility] on 10/06/2024 Bed assignment: 306-02  Pt meets inpatient criteria per: Wyline Pizza NP  Attending Physician will be: Pashayan MD  Report can be called un:lwpu: Adult unit: [phone]  Pt can arrive after PENDING VOL  Care Team Notified: [facility] Cherylynn Ernst RN, Wyline Pizza NP  [place] Paolo Okane LCSW-A   10/06/2024 12:15 PM

## 2024-10-06 NOTE — Consult Note (Signed)
 [facility] Psychiatric Consult Initial  Patient Name: .Javier Glover  MRN: [id number]  DOB: 04-17-1986  Consult Order details:  Orders (From admission, onward)     Start     Ordered   10/06/24 0813  CONSULT TO CALL ACT TEAM       Ordering Provider: Lorelle Aleck BROCKS, PA-C  Provider:  (Not yet assigned)  Question:  Reason for Consult?  Answer:  Psych consult   10/06/24 0812             Mode of Visit: In person    Psychiatry Consult Evaluation  Service Date: October 06, 2024 LOS:  LOS: 0 days  Chief Complaint SI to kill himself by cutting his wrist   Primary Psychiatric Diagnoses  Suicidal ideations  MDD   Assessment  Javier Glover is a 38 y.o. male admitted: Presented to the ED on 10/06/2024  5:22 AM accompanied by law enforcement for SI with a plan to cut his wrist. He carries the psychiatric diagnoses of MDD, substance use disorder, antisocial behavior, bipolar 1 disorder, cocaine use disorder and alcohol use disorder and has no reported past medical history.  His current presentation of depression and SI with a plan is most consistent with MDD severe. He meets criteria for inpatient psychiatric treatment based on active SI with a plan. He denies taking psychotropic medications at this time and is unable to recall past psychotropic medications. Historically, per chart review, he has had a positive response to Zoloft  50 mg for depression. He was noncompliant with medications prior to admission as evidenced by reporting that he stopped taking medication 1 month ago.   On initial examination, patient is alert and oriented x 4. His thought process is linear. Thought content is positive for SI with a plan and negative for  HI, paranoia and AVH. Objectively, no signs of acute psychosis on exam. His mood is depressed and affect is congruent. He has fair eye contact. His speech is coherent at a decreased tone and his responses are vague. However, he is cooperative and does not  appear to be in acute distress.  Please see plan below for detailed recommendations.   Diagnoses:  Active Hospital problems: Principal Problem:   Suicidal ideation Active Problems:   MDD (major depressive disorder), recurrent episode, severe (HCC)    Plan   ## Psychiatric Medication Recommendations:  Restart Zoloft  25 mg po for depression   ## Medical Decision Making Capacity: Not specifically addressed in this encounter  ## Further Work-up:  -- Add EKG  -- Pertinent labwork reviewed earlier this admission includes: CBC, CMP, BAL, and UDS pending collection   ## Disposition:-- We recommend inpatient psychiatric hospitalization when medically cleared. Patient is under voluntary admission status at this time; please IVC if attempts to leave hospital.  ## Behavioral / Environmental: - No specific recommendations at this time.     ## Safety and Observation Level:  - Based on my clinical evaluation, I estimate the patient to be at high risk of self harm in the current setting. - At this time, we recommend  1:1 Observation. This decision is based on my review of the chart including patient's history and current presentation, interview of the patient, mental status examination, and consideration of suicide risk including evaluating suicidal ideation, plan, intent, suicidal or self-harm behaviors, risk factors, and protective factors. This judgment is based on our ability to directly address suicide risk, implement suicide prevention strategies, and develop a safety plan while the patient is  in the clinical setting. Please contact our team if there is a concern that risk level has changed.  CSSR Risk Category:C-SSRS RISK CATEGORY: High Risk  Suicide Risk Assessment: Patient has following modifiable risk factors for suicide: active suicidal ideation, which we are addressing by recommending for inpatient. Patient has following non-modifiable or demographic risk factors for suicide: male  gender Patient has the following protective factors against suicide: family.  Thank you for this consult request. Recommendations have been communicated to the primary team.  We will continue to follow until inpatient psychiatric placement is obtained.  Teresa Wyline CROME, NP       History of Present Illness  Relevant Aspects of Hospital ED Course:   Admitted on 10/06/2024 for  Patient Report:  Patient states that he has been feeling like he wants to kill himself by cutting his wrist. He states that the suicidal thoughts came back a couple days ago due to family issues. However, he refuses to further elaborate on family issues.  He reports 1 suicide attempt by cutting his arm 1 month ago and shows this provider his left forearm where he is noted to have an old laceration to his forearm.  He reports feeling depressed. However, he is unable to describe depressive symptoms on exam, other than having thoughts of wanting to kill himself by cutting his wrists.  He reports poor sleep but does not give supporting details.  He reports an okay appetite.  He reports using cocaine since he was younger and states that he has not used in the past 1 to 2 weeks.  UDS is currently pending collection.  He reports occasional alcohol use and states that he does not drink often. He states that he last consumed alcohol last night, a couple beers. BAL negative on arrival. No reported alcohol withdrawal symptoms on exam. No history of alcohol withdrawal seizures or DT's/   His is homeless and does not share details pertaining to his living situation. 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   He denies outpatient psychiatry or therapy.   Psych ROS:  Depression: Yes, endorses feeling depressed.  Psychosis: (lifetime and current): No  Review of Systems  Constitutional: Negative.   HENT: Negative.    Eyes: Negative.   Respiratory: Negative.    Cardiovascular: Negative.   Gastrointestinal: Negative.    Genitourinary: Negative.   Musculoskeletal: Negative.   Neurological: Negative.   Psychiatric/Behavioral:  Positive for depression and suicidal ideas.      Psychiatric and Social History  Psychiatric History:  Information collected from the patient and the patient's EMR  Prev Dx/Sx:  Current Psych Provider: MDD, substance use disorder, antisocial behavior, bipolar 1 disorder, cocaine use disorder and alcohol use disorder  Home Meds (current): None Previous Med Trials: Zoloft  50 mg  Therapy: No  Prior Psych Hospitalization: Yes, [place]  Prior Self Harm: Yes  Family Hx suicide: No  Social History:  Occupational Hx: Unemployed  Legal Hx: No Living Situation: homeless   Access to weapons/lethal means: No  Substance History Alcohol: Yes Type of alcohol beer  Last Drink Yesterday, 10/05/24 Number of drinks per day 2 History of alcohol withdrawal seizures No History of DT's No Illicit drugs: Cocaine use (1-2 weeks ago) Rehab hx: Per patient, rehabilitation in [place]  a couple months ago  Exam Findings  Physical Exam:  Vital Signs:  Temp:  [97.8 F (36.6 C)-98 F (36.7 C)] 97.8 F (36.6 C) (12/10 0945) Pulse Rate:  [80-88] 88 (12/10 0945) Resp:  [  16] 16 (12/10 0945) BP: (122-132)/(80-83) 132/83 (12/10 0945) SpO2:  [99 %-100 %] 99 % (12/10 0945) Blood pressure 132/83, pulse 88, temperature 97.8 F (36.6 C), temperature source Oral, resp. rate 16, SpO2 99%. There is no height or weight on file to calculate BMI.  Physical Exam Constitutional:      Appearance: Normal appearance.  Cardiovascular:     Rate and Rhythm: Normal rate.  Pulmonary:     Effort: Pulmonary effort is normal.  Musculoskeletal:        General: Normal range of motion.     Cervical back: Normal range of motion.  Neurological:     Mental Status: He is oriented to person, place, and time.     Mental Status Exam: General Appearance: Disheveled  Orientation:  Full (Time,  Place, and Person)  Memory:  Immediate;   Fair  Concentration:  Concentration: Poor  Recall:  Poor  Attention  Poor  Eye Contact:  Minimal  Speech:  Slow  Language:  Fair  Volume:  Decreased  Mood: Depressed  Affect:  Flat  Thought Process:  Coherent  Thought Content:  Logical  Suicidal Thoughts:  Yes.  with intent/plan  Homicidal Thoughts:  No  Judgement:  Poor  Insight:  Lacking  Psychomotor Activity:  Normal  Akathisia:  No  Fund of Knowledge:  Fair      Assets:  Communication Skills Desire for Improvement  Cognition:  WNL  ADL's:  Intact  AIMS (if indicated):        Other History   These have been pulled in through the EMR, reviewed, and updated if appropriate.  Family History:  The patient's family history is not on file.  Medical History: Past Medical History:  Diagnosis Date   Cirrhosis (HCC)    Seizures (HCC)    Childhood.  Medications for a while.  No meds or seizures for a long time.    Surgical History: Past Surgical History:  Procedure Laterality Date   COLONOSCOPY WITH PROPOFOL  N/A 01/03/2020   Procedure: COLONOSCOPY WITH BIOPSY;  Surgeon: Jinny Carmine, MD;  Location: [facility];  Service: Endoscopy;  Laterality: N/A;  PRIORITY 3   ESOPHAGOGASTRODUODENOSCOPY (EGD) WITH PROPOFOL  N/A 01/03/2020   Procedure: ESOPHAGOGASTRODUODENOSCOPY (EGD) WITH PROPOFOL ;  Surgeon: Jinny Carmine, MD;  Location: [facility];  Service: Endoscopy;  Laterality: N/A;  PRIORITY 3   FRACTURE SURGERY       Medications:   Current Facility-Administered Medications:    LORazepam  (ATIVAN ) injection 0-4 mg, 0-4 mg, Intravenous, Q6H **OR** LORazepam  (ATIVAN ) tablet 0-4 mg, 0-4 mg, Oral, Q6H, Aberman, Caroline C, PA-C   [START ON 10/08/2024] LORazepam  (ATIVAN ) injection 0-4 mg, 0-4 mg, Intravenous, Q12H **OR** [START ON 10/08/2024] LORazepam  (ATIVAN ) tablet 0-4 mg, 0-4 mg, Oral, Q12H, Aberman, Caroline C, PA-C   thiamine  (VITAMIN B1) tablet 100 mg, 100 mg, Oral,  Daily, 100 mg at 10/06/24 0940 **OR** thiamine  (VITAMIN B1) injection 100 mg, 100 mg, Intravenous, Daily, Aberman, Caroline C, PA-C  Current Outpatient Medications:    citalopram  (CELEXA ) 10 MG tablet, Take 1 tablet (10 mg total) by mouth daily. (Patient not taking: Reported on 09/03/2021), Disp: 30 tablet, Rfl: 1   metroNIDAZOLE  (FLAGYL ) 500 MG tablet, Take 1 tablet (500 mg total) by mouth 3 (three) times daily. (Patient not taking: Reported on 09/03/2021), Disp: 30 tablet, Rfl: 0   omeprazole  (PRILOSEC) 10 MG capsule, Take 10 mg by mouth daily. (Patient not taking: Reported on 09/03/2021), Disp: , Rfl:    ondansetron  (ZOFRAN ) 4  MG tablet, Take 1 tablet (4 mg total) by mouth every 8 (eight) hours as needed for nausea, vomiting or refractory nausea / vomiting. (Patient not taking: Reported on 09/03/2021), Disp: 21 tablet, Rfl: 0   ondansetron  (ZOFRAN -ODT) 4 MG disintegrating tablet, Take 1 tablet (4 mg total) by mouth every 8 (eight) hours as needed for nausea or vomiting., Disp: 20 tablet, Rfl: 0   sulfamethoxazole -trimethoprim  (BACTRIM  DS) 800-160 MG tablet, Take 1 tablet by mouth 2 (two) times daily. (Patient not taking: Reported on 09/03/2021), Disp: 20 tablet, Rfl: 0  Allergies: Allergies  Allergen Reactions   Ibuprofen Rash    Curran Lenderman, Wyline CROME, NP

## 2024-10-07 LAB — TSH: TSH: 1.26 u[IU]/mL (ref 0.350–4.500)

## 2024-10-07 LAB — LIPID PANEL
Cholesterol: 197 mg/dL (ref 0–200)
HDL: 45 mg/dL (ref >40)
LDL Cholesterol: 134 mg/dL — ABNORMAL HIGH (ref 0–99)
Total CHOL/HDL Ratio: 4.4 ratio
Triglycerides: 88 mg/dL (ref <150)
VLDL: 18 mg/dL (ref 0–40)

## 2024-10-07 LAB — HEMOGLOBIN A1C
Hgb A1c MFr Bld: 5.8 % — ABNORMAL HIGH (ref 4.8–5.6)
Mean Plasma Glucose: 119.76 mg/dL

## 2024-10-07 MED ORDER — NICOTINE 14 MG/24HR TD PT24
14.0000 mg | MEDICATED_PATCH | Freq: Every day | TRANSDERMAL | Status: DC
  Administered 2024-10-09: 14 mg via TRANSDERMAL
  Filled 2024-10-07 (×6): qty 1

## 2024-10-07 MED ORDER — MENTHOL 3 MG MT LOZG
1.0000 | LOZENGE | OROMUCOSAL | Status: DC | PRN
  Administered 2024-10-07 – 2024-10-10 (×5): 3 mg via ORAL
  Filled 2024-10-07 (×2): qty 9

## 2024-10-07 MED ORDER — PHENOL 1.4 % MT LIQD
2.0000 | OROMUCOSAL | Status: DC | PRN
  Administered 2024-10-07 (×2): 2 via OROMUCOSAL
  Filled 2024-10-07: qty 177

## 2024-10-07 MED ORDER — SERTRALINE HCL 50 MG PO TABS
50.0000 mg | ORAL_TABLET | Freq: Every day | ORAL | Status: DC
  Administered 2024-10-08 – 2024-10-09 (×2): 50 mg via ORAL
  Filled 2024-10-07 (×2): qty 1

## 2024-10-07 NOTE — Group Note (Signed)
 Date:  10/07/2024 Time:  2:18 PM  Group Topic/Focus: Recreational Therapy- Drumming Group    Pt did attend recreational therapy drumming group.   Chrisa Hassan R Dhanya Bogle 10/07/2024, 2:18 PM

## 2024-10-07 NOTE — [facility] Suicide Risk Assessment (Signed)
 Suicide Risk Assessment  Admission Assessment    [facility] Admission Suicide Risk Assessment   Nursing information obtained from:  Patient Demographic factors:  Male Current Mental Status:  Self-harm thoughts Loss Factors:  NA Historical Factors:  Prior suicide attempts Risk Reduction Factors:  Positive social support  Principal Problem: MDD (major depressive disorder), recurrent episode, severe (HCC) Diagnosis:  Principal Problem:   MDD (major depressive disorder), recurrent episode, severe (HCC)  Subjective Data: Javier Glover is a 38 y.o. male  with a past psychiatric history of AUD, MDD, stimulant use disorder (amphetamine and cocaine) bipolar 1 disorder and antisocial behavior. Patient initially arrived to [facility] on 12/10 for SI and was admitted to [facility] on 12/10 for crisis stabalization and substance related issues. PMHx is significant for seizures.   On assessment patient is guarded and says that he has got a whole lot on his mind.  When asked to elaborate, patient just says life and that he is going through divorce with his wife.  Patient has been incarcerated for 2 years and 9 months and recently released on parole December 4.  He has been staying at the [employer] since then.  Patient expresses suicidal ideation before incarceration, while he was in prison, before he started using drugs and after he he started using drugs.  Patient says he has attempted suicide more than 5 times, previously running in front of traffic and cutting his forearm with a knife.  Patient has 9 children, though only keeps in touch with one, and denies any support system.  Continued Clinical Symptoms:  Alcohol Use Disorder Identification Test Final Score (AUDIT): 6 The Alcohol Use Disorders Identification Test, Guidelines for Use in Primary Care, Second Edition.  [employer] Writer [facility]). Score between 0-7:  no or low risk or alcohol related problems. Score between 8-15:   moderate risk of alcohol related problems. Score between 16-19:  high risk of alcohol related problems. Score 20 or above:  warrants further diagnostic evaluation for alcohol dependence and treatment.   CLINICAL FACTORS:   Depression:   Comorbid alcohol abuse/dependence Alcohol/Substance Abuse/Dependencies More than one psychiatric diagnosis Previous Psychiatric Diagnoses and Treatments   Musculoskeletal: Strength & Muscle Tone: within normal limits Gait & Station: normal Patient leans: N/A  Psychiatric Specialty Exam:  Presentation  General Appearance: Appropriate for Environment  Eye Contact:Fair  Speech:Normal Rate  Speech Volume:Normal  Handedness:No data recorded  Mood and Affect  Mood:Dysphoric  Affect:Restricted   Thought Process  Thought Processes:Goal Directed; Linear  Descriptions of Associations:Intact  Orientation:Full (Time, Place and Person)  Thought Content:Logical  History of Schizophrenia/Schizoaffective disorder:No data recorded Duration of Psychotic Symptoms:No data recorded Hallucinations:Hallucinations: None  Ideas of Reference:None  Suicidal Thoughts:Suicidal Thoughts: Yes, Passive  Homicidal Thoughts:Homicidal Thoughts: No   Sensorium  Memory:Immediate Fair  Judgment:Fair  Insight:Fair   Executive Functions  Concentration:Fair  Attention Span:Fair  Recall:Fair  Fund of Knowledge:Fair  Language:Fair   Psychomotor Activity  Psychomotor Activity:Psychomotor Activity: Normal   Assets  Assets:Communication Skills; Physical Health; Resilience   Sleep  Sleep:Sleep: Fair    Physical Exam: Physical Exam Constitutional:      General: He is not in acute distress.    Appearance: He is not ill-appearing, toxic-appearing or diaphoretic.  Pulmonary:     Effort: Pulmonary effort is normal.  Neurological:     General: No focal deficit present.     Mental Status: He is alert.    Review of Systems   Constitutional:  Negative for diaphoresis.  Psychiatric/Behavioral:  Positive for substance abuse and suicidal ideas. Negative for hallucinations.    Blood pressure 130/83, pulse 78, temperature 98 F (36.7 C), temperature source Oral, resp. rate 16, height 5' 9 (1.753 m), weight 85.3 kg, SpO2 98%. Body mass index is 27.76 kg/m.   COGNITIVE FEATURES THAT CONTRIBUTE TO RISK:  None    SUICIDE RISK:   Moderate:  Frequent suicidal ideation with limited intensity, and duration, some specificity in terms of plans, no associated intent, good self-control, limited dysphoria/symptomatology, some risk factors present, and identifiable protective factors, including available and accessible social support.  PLAN OF CARE:  I certify that inpatient services furnished can reasonably be expected to improve the patient's condition.   Alfornia Light, DO 10/07/2024, 8:16 PM

## 2024-10-07 NOTE — Group Note (Signed)
 LCSW Group Therapy Note   Group Date: 10/07/2024 Start Time: 1100 End Time: 1200   Participation:  did not attend  Type of Therapy:  Group Therapy  Topic: Healing Flames: Navigating Anger with Compassion  Objective:  Foster self-awareness and promote compassion toward oneself and others when dealing with anger.  Goals:  Help participants understand the underlying emotions and needs fueling anger. Provide coping strategies for healthier emotional expression and anger management.  Summary: This session explored anger as a volcano--an explosion driven by deeper feelings and unmet needs. Participants learned to identify anger triggers and underlying emotions, then practiced coping strategies like deep breathing, physical activity, and journaling. The group discussed healthy ways to manage anger before it escalates, using both personal reflection and shared experiences.  Therapeutic Modalities: Cognitive Behavioral Therapy (CBT): Challenging thoughts that fuel anger. Mindfulness: Increasing awareness of emotions and sensations.   Javier Glover, LCSWA 10/07/2024  12:14 PM

## 2024-10-07 NOTE — Group Note (Signed)
 Date:  10/07/2024 Time:  9:45 AM  Group Topic/Focus:  Goals Group:   The focus of this group is to help patients establish daily goals to achieve during treatment and discuss how the patient can incorporate goal setting into their daily lives to aide in recovery. Orientation:   The focus of this group is to educate the patient on the purpose and policies of crisis stabilization and provide a format to answer questions about their admission.  The group details unit policies and expectations of patients while admitted.    Participation Level:  Did Not Attend   Javier Glover 10/07/2024, 9:45 AM

## 2024-10-07 NOTE — Progress Notes (Signed)
 Pt redirected for making offensive statements towards peer. Pt angry when redirected, did talk with staff. Pt expressed anxiety, received hydroxyzine .

## 2024-10-07 NOTE — Group Note (Signed)
 Date:  10/07/2024 Time:  10:13 AM  Group Topic/Focus: Nutrition Group   Pt did not attend nutrition group  Javier Glover R Javier Glover 10/07/2024, 10:13 AM

## 2024-10-07 NOTE — Group Note (Signed)
 Date:  10/07/2024 Time:  9:23 PM  Group Topic/Focus:  Self Care:   The focus of this group is to help patients understand the importance of self-care in order to improve or restore emotional, physical, spiritual, interpersonal, and financial health.    Pt did not attend group.  Elin Seats L 10/07/2024, 9:23 PM

## 2024-10-07 NOTE — Plan of Care (Signed)

## 2024-10-07 NOTE — [facility] Group Notes (Signed)
 Adult Psychoeducational Group Note  Date:  10/07/2024 Time:  8:55 PM  Group Topic/Focus:  Wrap-Up Group:   The focus of this group is to help patients review their daily goal of treatment and discuss progress on daily workbooks.  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 Favorite part of the day eating food and meds   Lang Donia Law 10/07/2024, 8:55 PM

## 2024-10-07 NOTE — Plan of Care (Signed)
   Problem: Education: Goal: Knowledge of [place] [facility] Education information/materials will improve Outcome: Progressing Goal: Emotional status will improve Outcome: Progressing Goal: Mental status will improve Outcome: Progressing

## 2024-10-07 NOTE — Progress Notes (Signed)
(  Sleep Hours) -6  (Any PRNs that were needed, meds refused, or side effects to meds)- Trazodone  50 mg and hydroxyzine  25 mg  (Any disturbances and when (visitation, over night)-none  (Concerns raised by the patient)- none  (SI/HI/AVH)- passive SI

## 2024-10-07 NOTE — Group Note (Signed)
 Therapy Group Note  Group Topic:Other  Group Date: 10/07/2024 Start Time: 1500 End Time: 1530 Facilitators: Adeleine Pask G, OT   The primary objective of this topic is to explore and understand the concept of occupational balance in the context of daily living. The term occupational balance is defined broadly, encompassing all activities that occupy an individual's time and energy, including self-care, leisure, and work-related tasks. The goal is to guide participants towards achieving a harmonious blend of these activities, tailored to their personal values and life circumstances. This balance is aimed at enhancing overall well-being, not by equally distributing time across activities, but by ensuring that daily engagements are fulfilling and not draining. The content delves into identifying various barriers that individuals face in achieving occupational balance, such as overcommitment, misaligned priorities, external pressures, and lack of effective time management. The impact of these barriers on occupational performance, roles, and lifestyles is examined, highlighting issues like reduced efficiency, strained relationships, and potential health problems. Strategies for cultivating occupational balance are a key focus. These strategies include practical methods like time blocking, prioritizing tasks, establishing self-care rituals, decluttering, connecting with nature, and engaging in reflective practices. These approaches are designed to be adaptable and applicable to a wide range of life scenarios, promoting a proactive and mindful approach to daily living. The overall aim is to equip participants with the knowledge and tools to create a balanced lifestyle that supports their mental, emotional, and physical health, thereby improving their functional performance in daily life.       Participation Level: Engaged   Participation Quality: Independent   Behavior: Appropriate    Speech/Thought Process: Relevant   Affect/Mood: Appropriate   Insight: Fair   Judgement: Fair      Modes of Intervention: Education  Patient Response to Interventions:  Attentive   Plan: Continue to engage patient in OT groups 2 - 3x/week.  10/07/2024  Javier Glover, OT  Ervin Hensley, OT

## 2024-10-07 NOTE — Group Note (Signed)
 Date:  10/07/2024 Time:  11:52 AM  Group Topic/Focus: Social Work Group    Pt did attend social work group  Javier Glover R Nemesis Rainwater 10/07/2024, 11:52 AM

## 2024-10-07 NOTE — Group Note (Signed)
 Date:  10/07/2024 Time:  3:36 PM  Group Topic/Focus: Occupational Therapy     Pt did attend occupational therapy group  Javier Glover 10/07/2024, 3:36 PM

## 2024-10-07 NOTE — H&P (Cosign Needed Addendum)
 Psychiatric Admission Assessment Adult  Patient Identification:  Javier Glover MRN:  [id number] Date of Evaluation:  10/07/2024 Chief Complaint:  MDD (major depressive disorder), recurrent episode, severe (HCC) [F33.2] Principal Diagnosis:  MDD (major depressive disorder), recurrent episode, severe (HCC) Diagnosis:  Principal Problem:   MDD (major depressive disorder), recurrent episode, severe (HCC)  SUBJECTIVE:  CC:   I have got a whole lot on my mind  HPI: Javier Glover is a 38 y.o. male  with a past psychiatric history of AUD, MDD, stimulant use disorder (amphetamine and cocaine) bipolar 1 disorder and antisocial behavior. Patient initially arrived to [facility] on 12/10 for SI and was admitted to [facility] on 12/10 for crisis stabalization and substance related issues. PMHx is significant for seizures.   On assessment patient is guarded and says that he has got a whole lot on his mind.  When asked to elaborate, patient just says life and that he is going through divorce with his wife.  Patient has been incarcerated for 2 years and 9 months and recently released on parole December 4.  He has been staying at the [employer] [facility] since then.  Patient expresses suicidal ideation before incarceration, while he was in prison, before he started using drugs and after he he started using drugs.  Patient says he has attempted suicide more than 5 times, previously running in front of traffic and cutting his forearm with a knife.  Patient has 9 children, though only keeps in touch with one, and denies any support system.  Psychiatric ROS Mood Symptoms Endorses difficulty with sleep lately that he attributes to hot flashes, endorses low energy, anhedonia, and SI. Denies feelings of guilt, worthlessness or hopelessness or difficulty concentrating Anxiety Symptoms Endorses anxious mood, denies panic attacks, agoraphobia, social anxiety Trauma Symptoms Denies symptoms of reexperiencing,  hypervigilance or nightmares Psychosis Symptoms Denies AVH, not responding to internal stimuli.  Endorses some paranoia, typically in the context of stimulant use  Past Psychiatric History: Current psychiatrist: Denies Current therapist: Denies Previous psychiatric diagnoses: MDD, BPD, schizophrenia Psychiatric Medications:   -Home Meds:  -Past Med Trials: zoloft  Psychiatric hospitalization(s): yes, [facility] History of suicide (obtained from HPI): Endorses more than 5 attempts, endorses running into traffic and cutting his wrist NSSIB: Remote history, endorses 1 time of NSSIB  Substance Abuse History: Alcohol: yes, 2 beers a day Tobacco: Yes, half pack daily Cannabis: yes Cocaine: yes Meth: denies, though previous use per chart review Rehab history: [facility]  a couple months ago   Past Medical History: Medical diagnoses:  Medications: denies Allergies: Ibuprofen-rash PCP: no Hospitalizations:  Seizures: Hx of seizures, previously on Depakote, most recent seizure 10 years ago  Social History: Current living situation: [employer], was released from prison on 09/30/2024.  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, most recently landscaping Marital status:  Children: 9 children, only has contact with his 57 year old daughter Legal: yes, incarcerated at least twice  Family Psychiatric History: Psychiatric diagnoses: denies Suicide history: denies Substance use history: father- crack   Family Medical History: None per chart review  Columbia Scale:  Flowsheet Row [facility] Most recent reading at 10/06/2024 10:30 PM ED from 10/06/2024 in [facility] at [facility] Most recent reading at 10/06/2024  5:30 AM ED from 01/29/2024 in [facility] Emergency Department  at [facility] Most recent reading at 01/29/2024 11:27 AM  C-SSRS RISK CATEGORY High Risk High Risk No Risk     Tobacco Screening:  Tobacco Use History[1]  BH Tobacco Counseling     Are you interested in Tobacco Cessation Medications?  No, patient refused Counseled patient on smoking cessation:  Refused/Declined practical counseling Reason Tobacco Screening Not Completed: Patient Refused Screening    Allergies:   Allergies[2]  OBJECTIVE:  Physical Examination:  Physical Exam Constitutional:      General: He is not in acute distress.    Appearance: He is not ill-appearing, toxic-appearing or diaphoretic.  Pulmonary:     Effort: Pulmonary effort is normal.  Skin:    Findings: Laceration present.     Comments: 10-15 cm healed laceration on L flexor surface forearm  Neurological:     General: No focal deficit present.     Mental Status: He is alert.    Review of Systems  Neurological:  Negative for dizziness and headaches.  Psychiatric/Behavioral:  Positive for substance abuse and suicidal ideas. Negative for hallucinations. The patient is nervous/anxious.    Blood pressure 114/79, pulse 85, temperature 98 F (36.7 C), temperature source Oral, resp. rate 16, height 5' 9 (1.753 m), weight 85.3 kg, SpO2 100%. Body mass index is 27.76 kg/m.  Metabolic disorder labs:  Lab Results  Component Value Date   HGBA1C 5.8 (H) 10/07/2024   MPG 119.76 10/07/2024   No results found for: PROLACTIN Lab Results  Component Value Date   CHOL 197 10/07/2024   TRIG 88 10/07/2024   HDL 45 10/07/2024   CHOLHDL 4.4 10/07/2024   VLDL 18 10/07/2024   LDLCALC 134 (H) 10/07/2024   LDLCALC 126 (H) 08/10/2019    Results for orders placed or performed during the hospital encounter of 10/06/24 (from the past 48 hours)  TSH     Status: None   Collection Time: 10/07/24  6:36 AM  Result Value Ref Range   TSH 1.260 0.350 - 4.500 uIU/mL    Comment: Performed at [facility], [address], [place], [place] [address]  Lipid panel     Status: Abnormal   Collection Time: 10/07/24  6:36 AM  Result Value Ref Range   Cholesterol 197 0 - 200 mg/dL    Comment:        ATP III CLASSIFICATION:  <200     mg/dL   Desirable  799-760  mg/dL   Borderline High  >=759    mg/dL   High           Triglycerides 88 <150 mg/dL   HDL 45 >59 mg/dL   Total CHOL/HDL Ratio 4.4 RATIO   VLDL 18 0 - 40 mg/dL   LDL Cholesterol 865 (H) 0 - 99 mg/dL    Comment:        Total Cholesterol/HDL:CHD Risk Coronary Heart Disease Risk Table                     Men   Women  1/2 Average Risk   3.4   3.3  Average Risk       5.0   4.4  2 X Average Risk   9.6   7.1  3 X Average Risk  23.4   11.0        Use the calculated Patient Ratio above and the CHD Risk Table to determine the patient's CHD Risk.        ATP III CLASSIFICATION (LDL):  <  100     mg/dL   Optimal  899-870  mg/dL   Near or Above                    Optimal  130-159  mg/dL   Borderline  839-810  mg/dL   High  >809     mg/dL   Very High Performed at [facility], [address] [place], [place] [address]   Hemoglobin A1c     Status: Abnormal   Collection Time: 10/07/24  6:36 AM  Result Value Ref Range   Hgb A1c MFr Bld 5.8 (H) 4.8 - 5.6 %    Comment: (NOTE) Diagnosis of Diabetes The following HbA1c ranges recommended by the American Diabetes Association (ADA) may be used as an aid in the diagnosis of diabetes mellitus.  Hemoglobin             Suggested A1C NGSP%              Diagnosis  <5.7                   Non Diabetic  5.7-6.4                Pre-Diabetic  >6.4                   Diabetic  <7.0                   Glycemic control for                       adults with diabetes.     Mean Plasma Glucose 119.76 mg/dL    Comment: Performed at [facility], [address], [place], [place] [address]    Blood alcohol level:  Lab Results  Component Value Date   [facility] <15  10/06/2024   ETH 243 (H) 08/09/2019    Current Medications: Current Facility-Administered Medications  Medication Dose Route Frequency Provider Last Rate Last Admin   acetaminophen  (TYLENOL ) tablet 650 mg  650 mg Oral Q6H PRN White, Patrice L, NP       alum & mag hydroxide-simeth (MAALOX/MYLANTA) 200-200-20 MG/5ML suspension 30 mL  30 mL Oral Q4H PRN White, Patrice L, NP       haloperidol (HALDOL) tablet 5 mg  5 mg Oral TID PRN White, Patrice L, NP       And   diphenhydrAMINE (BENADRYL) capsule 50 mg  50 mg Oral TID PRN White, Patrice L, NP       haloperidol lactate (HALDOL) injection 5 mg  5 mg Intramuscular TID PRN White, Patrice L, NP       And   diphenhydrAMINE (BENADRYL) injection 50 mg  50 mg Intramuscular TID PRN White, Patrice L, NP       And   LORazepam  (ATIVAN ) injection 2 mg  2 mg Intramuscular TID PRN White, Patrice L, NP       haloperidol lactate (HALDOL) injection 10 mg  10 mg Intramuscular TID PRN White, Patrice L, NP       And   diphenhydrAMINE (BENADRYL) injection 50 mg  50 mg Intramuscular TID PRN White, Patrice L, NP       And   LORazepam  (ATIVAN ) injection 2 mg  2 mg Intramuscular TID PRN White, Patrice L, NP       hydrOXYzine  (ATARAX ) tablet 25 mg  25 mg Oral TID PRN Teresa Wyline CROME, NP  25 mg at 10/06/24 2201   hydrOXYzine  (ATARAX ) tablet 25 mg  25 mg Oral Q6H PRN White, Patrice L, NP       loperamide (IMODIUM) capsule 2-4 mg  2-4 mg Oral PRN White, Patrice L, NP       LORazepam  (ATIVAN ) tablet 1 mg  1 mg Oral Q6H PRN White, Patrice L, NP       magnesium  hydroxide (MILK OF MAGNESIA) suspension 30 mL  30 mL Oral Daily PRN White, Patrice L, NP       multivitamin with minerals tablet 1 tablet  1 tablet Oral Daily White, Patrice L, NP   1 tablet at 10/07/24 9077   ondansetron  (ZOFRAN -ODT) disintegrating tablet 4 mg  4 mg Oral Q6H PRN White, Patrice L, NP       sertraline  (ZOLOFT ) tablet 25 mg  25 mg Oral Daily White, Patrice L, NP   25 mg at 10/07/24 9077   thiamine   (Vitamin B-1) tablet 100 mg  100 mg Oral Daily White, Patrice L, NP   100 mg at 10/07/24 9077   traZODone  (DESYREL ) tablet 50 mg  50 mg Oral QHS PRN White, Patrice L, NP   50 mg at 10/06/24 2201    PTA Medications: Medications Prior to Admission  Medication Sig Dispense Refill Last Dose/Taking   citalopram  (CELEXA ) 10 MG tablet Take 1 tablet (10 mg total) by mouth daily. (Patient not taking: Reported on 09/03/2021) 30 tablet 1    omeprazole  (PRILOSEC) 10 MG capsule Take 10 mg by mouth daily. (Patient not taking: Reported on 09/03/2021)      ondansetron  (ZOFRAN ) 4 MG tablet Take 1 tablet (4 mg total) by mouth every 8 (eight) hours as needed for nausea, vomiting or refractory nausea / vomiting. (Patient not taking: Reported on 09/03/2021) 21 tablet 0    ondansetron  (ZOFRAN -ODT) 4 MG disintegrating tablet Take 1 tablet (4 mg total) by mouth every 8 (eight) hours as needed for nausea or vomiting. (Patient not taking: Reported on 10/06/2024) 20 tablet 0     Mental Status Exam:  Appearance: Appropriate for environment, middle-aged black man resting supine in hospital bed  Behavior: No psychomotor agitation, fair eye contact  Attitude: Guarded, calm  Speech: Normal rate and volume, garbled, occasionally incomprehensible  Mood: Dysphoric  Affect: Restricted  Thought Process: Linear, GD  Thought Content: WNL  SI/HI: Endorses passive SI, wishes to be dead  Perceptions: Denies AVH, not appearing to be responding to internal stimuli  Judgement: Poor  Insight: Poor  Fund of Knowledge: WNL    ASSESSMENT: Given this patient's depressed mood and SI I can reasonably include substance-induced mood disorder and MDD on the differential, though timeline of onset of depressive symptoms not entirely clear.  Recommending inpatient substance use treatment, however patient says he does not need rehab. Discussed with patient we would be unable to get him to long-term psychiatric facility, and to consider rehab.  Patient says he is now interested in 90 day program for residential rehabilitation. There is some concern for secondary gain given patient recently incarcerated, and not willing to consider rehab until learning there is no other option other than back to shelter.  Will restart Zoloft  for mood and continue CIWA daily thiamine  for AUD.  PLAN: Psychiatric Diagnoses and Treatment # Substance-induced mood disorder versus major depressive disorder - Start Zoloft  50 mg daily  # AUD, stimulant use disorder - continue CIWA with prn ativan  - Thiamine  100 mg daily  PRN's - Trazodone  50 mg at  bedtime as needed for insomnia - Atarax  25 mg TID as needed for anxiety - Agitation Protocol: Haldol, Benadryl, Ativan   2. Active Medical Issues #Nicotine withdrawal - Patient in need of nicotine replacement; nicotine patch 14 mg / 24 hours ordered. Smoking cessation encouraged  Other as needed medications  Tylenol  650 mg every 6 hours as needed for pain Mylanta 30 mL every 4 hours as needed for indigestion Milk of magnesia 30 mL daily as needed for constipation  The risks/benefits/side-effects/alternatives to the above medication(s) were discussed in detail with the patient and time was given for questions. The patient consents to medication trial. FDA black box warnings, if present, were discussed.  3. Safety and Monitoring: - Voluntary admission to inpatient psychiatric unit for safety, stabilization and treatment - Daily contact with patient to assess and evaluate symptoms and progress in treatment - Patient's case to be discussed in multi-disciplinary team meeting - Observation Level: q15 minute checks - Vital signs:  q12 hours - Precautions: suicide, elopement, and assault  4. Routine and other pertinent labs: EKG monitoring: QTc: 426  Metabolism / endocrine: BMI: Body mass index is 27.76 kg/m.  CBC: unremarkable CMP: unremarkable UDS: cocaine Ethanol: <15 TSH: WNL  5.   Group  Therapy: - Encouraged patient to participate in unit milieu and in scheduled group therapies  - Short Term Goals: Ability to disclose and discuss suicidal ideas and Ability to identify triggers associated with substance abuse/mental health issues will improve - Long Term Goals: Improvement in symptoms so as ready for discharge - Patient is encouraged to participate in group therapy while admitted to the psychiatric unit. - We will address other chronic and acute stressors, which contributed to the patient's MDD (major depressive disorder), recurrent episode, severe (HCC) in order to reduce the risk of self-harm at discharge.  6.   Discharge Planning:  - Social work and case management to assist with discharge planning and identification of hospital follow-up needs prior to discharge - Estimated LOS: 2-5 days  - Discharge Concerns: Need to establish a safety plan; Medication compliance and effectiveness - Discharge Goals: Return home with outpatient referrals for mental health follow-up including medication management/psychotherapy  I certify that inpatient services furnished can reasonably be expected to improve the patient's condition.      Alfornia Light, DO, PGY-1, Psychiatry Residency  12/11/202511:39 AM        [1]  Social History Tobacco Use  Smoking Status Every Day   Current packs/day: 0.50   Average packs/day: 0.5 packs/day for 14.0 years (7.0 ttl pk-yrs)   Types: Cigarettes  Smokeless Tobacco Never  Tobacco Comments   12/29/19 - currently smokes 1 pack/week  [2]  Allergies Allergen Reactions   Ibuprofen Rash

## 2024-10-07 NOTE — Plan of Care (Signed)
   Problem: Education: Goal: Emotional status will improve Outcome: Not Progressing Goal: Mental status will improve Outcome: Not Progressing Goal: Verbalization of understanding the information provided will improve Outcome: Not Progressing

## 2024-10-07 NOTE — Group Note (Signed)
 Recreation Therapy Group Note   Group Topic:Other  Group Date: 10/07/2024 Start Time: 1305 End Time: 1350 Facilitators: Lynnix Schoneman-McCall, LRT,CTRS Location: [address] Dayroom   Activity Description/Intervention: Therapeutic Drumming. Patients with peers and staff were given the opportunity to engage in a leader facilitated [facility] Group Empowerment Drumming Circle with staff from the [employer], in partnership with [employer]. Teaching laboratory technician and trained [employer], Norleen Mon leading with LRT observing and documenting intervention and pt response. This evidenced-based practice targets 7 areas of health and wellbeing in the human experience including: stress-reduction, exercise, self-expression, camaraderie/support, nurturing, spirituality, and music-making (leisure).   Goal Area(s) Addresses:  Patient will engage in pro-social way in music group.  Patient will follow directions of drum leader on the first prompt. Patient will demonstrate no behavioral issues during group.  Patient will identify if a reduction in stress level occurs as a result of participation in therapeutic drum circle.    Education: Leisure exposure, Pharmacologist, Musical expression, Discharge Planning   Affect/Mood: Appropriate   Participation Level: Engaged   Participation Quality: Independent   Behavior: Appropriate   Speech/Thought Process: Focused   Insight: Good   Judgement: Good   Modes of Intervention: Teaching Laboratory Technician   Patient Response to Interventions:  Engaged   Education Outcome:  In group clarification offered    Clinical Observations/Individualized Feedback: Javier Glover was engaged and attentive during group. Javier Glover was focused and followed along well with the volunteer as he led the group. Javier Glover left early and didn't return.    Plan: Continue to engage patient in RT group sessions 2-3x/week.   Javier Glover, LRT,CTRS 10/07/2024 3:57  PM

## 2024-10-08 ENCOUNTER — Encounter (HOSPITAL_COMMUNITY): Payer: Self-pay

## 2024-10-08 DIAGNOSIS — F1590 Other stimulant use, unspecified, uncomplicated: Secondary | ICD-10-CM

## 2024-10-08 DIAGNOSIS — F840 Autistic disorder: Secondary | ICD-10-CM

## 2024-10-08 DIAGNOSIS — F39 Unspecified mood [affective] disorder: Secondary | ICD-10-CM

## 2024-10-08 MED ORDER — HYDROXYZINE HCL 50 MG PO TABS
50.0000 mg | ORAL_TABLET | Freq: Three times a day (TID) | ORAL | Status: DC | PRN
  Administered 2024-10-08 – 2024-10-13 (×6): 50 mg via ORAL
  Filled 2024-10-08 (×7): qty 1

## 2024-10-08 MED ORDER — ENSURE PLUS HIGH PROTEIN PO LIQD
237.0000 mL | Freq: Two times a day (BID) | ORAL | Status: DC
  Administered 2024-10-09 – 2024-10-10 (×4): 237 mL via ORAL
  Filled 2024-10-08 (×7): qty 237

## 2024-10-08 NOTE — Progress Notes (Signed)
(  Sleep Hours) -7.75 as of 0530 (Any PRNs that were needed, meds refused, or side effects to meds)- prn hydroxyzine  trazodone , phenol mouth spray and cepacol lozenger @ 2051 (Any disturbances and when (visitation, over night)-none (Concerns raised by the patient)- none (SI/HI/AVH)- denies all

## 2024-10-08 NOTE — Group Note (Signed)
 Recreation Therapy Group Note   Group Topic:Leisure Education  Group Date: 10/08/2024 Start Time: 0930 End Time: 1002 Facilitators: Toye Rouillard-McCall, LRT,CTRS Location: [address] Dayroom   Group Topic: Leisure Education   Goal Area(s) Addresses:  Patient will successfully identify positive leisure and recreation activities.  Patient will acknowledge benefits of participation in healthy leisure activities post discharge.  Patient will actively work with peers toward a shared goal.   Behavioral Response:    Intervention: Competitive Group Game   Activity: Guess the [employer]. The game is divided into 6 categories (Pop, R&B, Rock, Hip Hop, Dance and Indie). In teams of 3-4, patients took turns spinning the wheel. Whatever category the needle landed on, the group would pull a card from that stack. One of group members reads the lyric to their group. Their group has to guess the missing lyric. If they get the correct answer, they keep the card. If not, the other team gets the chance to steal the point. The team with the most cards at the end wins the game.   Education: Teacher, English As A Foreign Language, Stress Management, Discharge Planning  Education Outcome: Acknowledges education/In group clarification offered/Needs additional education   Affect/Mood: N/A   Participation Level: Did not attend    Clinical Observations/Individualized Feedback:      Plan: Continue to engage patient in RT group sessions 2-3x/week.   Demontrae Gilbert-McCall, LRT,CTRS 10/08/2024 11:58 AM

## 2024-10-08 NOTE — Group Note (Signed)
 Date:  10/08/2024 Time:  7:29 PM  Group Topic/Focus: Pictionary  A mental-health-friendly Pictionary game should use calm, positive, and non-triggering prompts--such as simple objects (sun, tree, cup), self-care activities (sleeping, listening to music, gardening), and uplifting concepts (hope, balance, friendship)--to support relaxation, confidence, and social connection. Choose very easy drawings, avoid competitive pressure by removing timers, and allow teamwork or collaborative drawing to reduce anxiety. Keeping prompts gentle and familiar helps patients engage comfortably, while the creative activity itself encourages emotional expression, focus, and a sense of safety.    Participation Level:  Did Not Attend   Javier Glover 10/08/2024, 7:29 PM

## 2024-10-08 NOTE — BH IP Treatment Plan (Signed)
 Interdisciplinary Treatment and Diagnostic Plan Update  10/08/2024 Time of Session: 10:35 AM Javier Glover MRN: [id number]  Principal Diagnosis: MDD (major depressive disorder), recurrent episode, severe (HCC)  Secondary Diagnoses: Principal Problem:   MDD (major depressive disorder), recurrent episode, severe (HCC)   Current Medications:  Current Facility-Administered Medications  Medication Dose Route Frequency Provider Last Rate Last Admin   acetaminophen  (TYLENOL ) tablet 650 mg  650 mg Oral Q6H PRN White, Patrice L, NP       alum & mag hydroxide-simeth (MAALOX/MYLANTA) 200-200-20 MG/5ML suspension 30 mL  30 mL Oral Q4H PRN White, Patrice L, NP       haloperidol (HALDOL) tablet 5 mg  5 mg Oral TID PRN White, Patrice L, NP       And   diphenhydrAMINE (BENADRYL) capsule 50 mg  50 mg Oral TID PRN White, Patrice L, NP       haloperidol lactate (HALDOL) injection 5 mg  5 mg Intramuscular TID PRN White, Patrice L, NP       And   diphenhydrAMINE (BENADRYL) injection 50 mg  50 mg Intramuscular TID PRN White, Patrice L, NP       And   LORazepam  (ATIVAN ) injection 2 mg  2 mg Intramuscular TID PRN White, Patrice L, NP       haloperidol lactate (HALDOL) injection 10 mg  10 mg Intramuscular TID PRN White, Patrice L, NP       And   diphenhydrAMINE (BENADRYL) injection 50 mg  50 mg Intramuscular TID PRN White, Patrice L, NP       And   LORazepam  (ATIVAN ) injection 2 mg  2 mg Intramuscular TID PRN White, Patrice L, NP       hydrOXYzine  (ATARAX ) tablet 50 mg  50 mg Oral TID PRN Prentis Kitchens A, DO   50 mg at 10/08/24 1334   loperamide (IMODIUM) capsule 2-4 mg  2-4 mg Oral PRN White, Patrice L, NP       LORazepam  (ATIVAN ) tablet 1 mg  1 mg Oral Q6H PRN White, Patrice L, NP       magnesium  hydroxide (MILK OF MAGNESIA) suspension 30 mL  30 mL Oral Daily PRN White, Patrice L, NP       menthol (CEPACOL) lozenge 3 mg  1 lozenge Oral PRN Faunce, Alina, DO   3 mg at 10/08/24 1335   multivitamin with  minerals tablet 1 tablet  1 tablet Oral Daily White, Patrice L, NP   1 tablet at 10/08/24 0843   nicotine (NICODERM CQ - dosed in mg/24 hours) patch 14 mg  14 mg Transdermal Daily Faunce, Alina, DO       ondansetron  (ZOFRAN -ODT) disintegrating tablet 4 mg  4 mg Oral Q6H PRN White, Patrice L, NP       phenol (CHLORASEPTIC) mouth spray 2 spray  2 spray Mouth/Throat PRN Faunce, Alina, DO   2 spray at 10/07/24 2052   sertraline  (ZOLOFT ) tablet 50 mg  50 mg Oral Daily Faunce, Alina, DO   50 mg at 10/08/24 9156   thiamine  (Vitamin B-1) tablet 100 mg  100 mg Oral Daily White, Patrice L, NP   100 mg at 10/08/24 9156   traZODone  (DESYREL ) tablet 50 mg  50 mg Oral QHS PRN White, Patrice L, NP   50 mg at 10/07/24 2051   PTA Medications: Medications Prior to Admission  Medication Sig Dispense Refill Last Dose/Taking   citalopram  (CELEXA ) 10 MG tablet Take 1 tablet (10 mg total) by mouth  daily. (Patient not taking: Reported on 09/03/2021) 30 tablet 1    omeprazole  (PRILOSEC) 10 MG capsule Take 10 mg by mouth daily. (Patient not taking: Reported on 09/03/2021)      ondansetron  (ZOFRAN ) 4 MG tablet Take 1 tablet (4 mg total) by mouth every 8 (eight) hours as needed for nausea, vomiting or refractory nausea / vomiting. (Patient not taking: Reported on 09/03/2021) 21 tablet 0    ondansetron  (ZOFRAN -ODT) 4 MG disintegrating tablet Take 1 tablet (4 mg total) by mouth every 8 (eight) hours as needed for nausea or vomiting. (Patient not taking: Reported on 10/06/2024) 20 tablet 0     Patient Stressors: Financial difficulties   Medication change or noncompliance   Substance abuse    Patient Strengths: Automotive Engineer for treatment/growth  Supportive family/friends   Treatment Modalities: Medication Management, Group therapy, Case management,  1 to 1 session with clinician, Psychoeducation, Recreational therapy.   Physician Treatment Plan for Primary Diagnosis: MDD (major depressive  disorder), recurrent episode, severe (HCC) Long Term Goal(s):     Short Term Goals: Ability to disclose and discuss suicidal ideas Ability to identify triggers associated with substance abuse/mental health issues will improve  Medication Management: Evaluate patient's response, side effects, and tolerance of medication regimen.  Therapeutic Interventions: 1 to 1 sessions, Unit Group sessions and Medication administration.  Evaluation of Outcomes: Not Progressing  Physician Treatment Plan for Secondary Diagnosis: Principal Problem:   MDD (major depressive disorder), recurrent episode, severe (HCC)  Long Term Goal(s):     Short Term Goals: Ability to disclose and discuss suicidal ideas Ability to identify triggers associated with substance abuse/mental health issues will improve     Medication Management: Evaluate patient's response, side effects, and tolerance of medication regimen.  Therapeutic Interventions: 1 to 1 sessions, Unit Group sessions and Medication administration.  Evaluation of Outcomes: Not Progressing   RN Treatment Plan for Primary Diagnosis: MDD (major depressive disorder), recurrent episode, severe (HCC) Long Term Goal(s): Knowledge of disease and therapeutic regimen to maintain health will improve  Short Term Goals: Ability to remain free from injury will improve, Ability to verbalize frustration and anger appropriately will improve, Ability to demonstrate self-control, Ability to participate in decision making will improve, Ability to verbalize feelings will improve, Ability to disclose and discuss suicidal ideas, Ability to identify and develop effective coping behaviors will improve, and Compliance with prescribed medications will improve  Medication Management: RN will administer medications as ordered by provider, will assess and evaluate patient's response and provide education to patient for prescribed medication. RN will report any adverse and/or side  effects to prescribing provider.  Therapeutic Interventions: 1 on 1 counseling sessions, Psychoeducation, Medication administration, Evaluate responses to treatment, Monitor vital signs and CBGs as ordered, Perform/monitor CIWA, COWS, AIMS and Fall Risk screenings as ordered, Perform wound care treatments as ordered.  Evaluation of Outcomes: Not Progressing   LCSW Treatment Plan for Primary Diagnosis: MDD (major depressive disorder), recurrent episode, severe (HCC) Long Term Goal(s): Safe transition to appropriate next level of care at discharge, Engage patient in therapeutic group addressing interpersonal concerns.  Short Term Goals: Engage patient in aftercare planning with referrals and resources, Increase social support, Increase ability to appropriately verbalize feelings, Increase emotional regulation, Facilitate acceptance of mental health diagnosis and concerns, Facilitate patient progression through stages of change regarding substance use diagnoses and concerns, Identify triggers associated with mental health/substance abuse issues, and Increase skills for wellness and recovery  Therapeutic Interventions: Assess for  all discharge needs, 1 to 1 time with Child psychotherapist, Explore available resources and support systems, Assess for adequacy in community support network, Educate family and significant other(s) on suicide prevention, Complete Psychosocial Assessment, Interpersonal group therapy.  Evaluation of Outcomes: Not Progressing   Progress in Treatment: Attending groups: No. Participating in groups: No. Taking medication as prescribed: Yes. Toleration medication: Yes. Family/Significant other contact made: No, will contact:  Damien Dayhoff (p.o.) [phone] 1st attempt 12/12 @ 1:35 pm Patient understands diagnosis: Yes. Discussing patient identified problems/goals with staff: Yes. Medical problems stabilized or resolved: Yes. Denies suicidal/homicidal ideation:  Yes. Issues/concerns per patient self-inventory: No.  New problem(s) identified:  No  New Short Term/Long Term Goal(s):    medication stabilization, elimination of SI thoughts, development of comprehensive mental wellness plan.   Patient Goals:  I want to go to along-term substance use program.  Discharge Plan or Barriers:  Patient recently admitted. CSW will continue to follow and assess for appropriate referrals and possible discharge planning.    Reason for Continuation of Hospitalization: Medication stabilization Suicidal ideation  Estimated Length of Stay:  5 - 7 days  Last 3 [place] Suicide Severity Risk Score: Flowsheet Row [facility] (Current[facility] Most recent reading at 10/06/2024 10:30 PM ED from 10/06/2024 in [facility] Department at [facility] Most recent reading at 10/06/2024  5:30 AM ED from 01/29/2024 in [facility] Emergency Department at [facility] Most recent reading at 01/29/2024 11:27 AM  C-SSRS RISK CATEGORY High Risk High Risk No Risk    Last PHQ 2/9 Scores:     No data to display          Scribe for Treatment Team: Alayia Meggison O Laurna Shetley, LCSWA 10/08/2024 6:26 PM
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
## 2024-10-08 NOTE — Group Note (Signed)
 Date:  10/08/2024 Time:  4:41 PM  Group Topic/Focus:  Self Care:   The focus of this group is to help patients understand the importance of self-care and sleep hygiene in order to improve or restore emotional, physical, spiritual, interpersonal, and financial health.    Participation Level:  Minimal  Participation Quality:  Inattentive  Affect:  Appropriate  Cognitive:  Alert, Appropriate, and Oriented  Insight: Improving  Engagement in Group:  Limited  Modes of Intervention:  Discussion  Javier Glover 10/08/2024, 4:41 PM

## 2024-10-08 NOTE — Group Note (Signed)
 Date:  10/08/2024 Time:  10:03 PM  Group Topic/Focus:  Wrap-Up Group:   The focus of this group is to help patients review their daily goal of treatment and discuss progress on daily workbooks.    Participation Level:  Active  Participation Quality:  Appropriate  Affect:  Appropriate  Cognitive:  Appropriate  Insight: Improving  Engagement in Group:  Engaged  Modes of Intervention:  Discussion  Additional Comments:  Client attended group which was led by members of Alcoholics Anonymous. Pt shared what brought him to the hospital. After group, pt shared with the tech a concern stating He does crack, I would know. He was high and that why I couldn't stay in here. Pt clarified he was speaking about the older white male AA volunteer.   Javier Glover 10/08/2024, 10:03 PM

## 2024-10-08 NOTE — Plan of Care (Signed)
   Problem: Education: Goal: Knowledge of [place] [facility] Education information/materials will improve Outcome: Progressing Goal: Emotional status will improve Outcome: Progressing Goal: Mental status will improve Outcome: Progressing
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
## 2024-10-08 NOTE — Group Note (Signed)
 Date:  10/08/2024 Time:  1:06 PM  Group Topic/Focus: Social wellness Wellness Toolbox:   The focus of this group is to discuss various aspects of wellness, balancing those aspects and exploring ways to increase the ability to experience wellness.  Patients will create a wellness toolbox for use upon discharge.    Participation Level:  Active  Participation Quality:  Appropriate  Affect:  Appropriate  Cognitive:  Alert  Insight: Appropriate  Engagement in Group:  Engaged  Modes of Intervention:  Discussion  Additional Comments:    Dolores CHRISTELLA Fredericks 10/08/2024, 1:06 PM

## 2024-10-08 NOTE — Group Note (Signed)
 Date:  10/08/2024 Time:  10:08 AM  Group Topic/Focus: Self assessment orientation group Goals Group:   The focus of this group is to help patients establish daily goals to achieve during treatment and discuss how the patient can incorporate goal setting into their daily lives to aide in recovery. Self Care:   The focus of this group is to help patients understand the importance of self-care in order to improve or restore emotional, physical, spiritual, interpersonal, and financial health.    Participation Level:  Did Not Attend   Javier Glover 10/08/2024, 10:08 AM
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
## 2024-10-08 NOTE — Plan of Care (Signed)

## 2024-10-08 NOTE — Progress Notes (Signed)
 CONTACT NOTE:   Damien Dayhoff (p.o.) [phone]   [facility] Office [phone]  This writer contacted [employer] Office to confirm this patient's officer's phone number. This clinical research associate left a engineer, technical sales to [employer] cell phone number above requesting a call back regarding a parolee.   SIGNED: Caleb Decock Nunez-Uva, LCSW-A

## 2024-10-08 NOTE — Group Note (Signed)
 Date:  10/08/2024 Time:  10:27 AM  Group Topic/Focus: recreational therapy played finished the lyric  Guess the Lyrics can be a fun and effective tool for mental health and critical thinking by engaging individuals with emotional content, boosting memory, and fostering empathy. By guessing missing lyrics from songs that address themes like resilience, love, or struggle, participants can connect with their own emotions, reflect on their personal experiences, and practice cognitive flexibility. Discussing the meaning of the lyrics afterward encourages deeper self-awareness and perspective-taking, promoting mental well-being. This activity also offers a creative outlet for self-expression and can serve as a conversation starter on important topics like vulnerability, hope, and personal growth.    Participation Level:  Did Not Attend   Javier Glover 10/08/2024, 10:27 AM

## 2024-10-09 MED ORDER — DM-GUAIFENESIN ER 30-600 MG PO TB12
1.0000 | ORAL_TABLET | Freq: Two times a day (BID) | ORAL | Status: DC
  Administered 2024-10-09 – 2024-10-14 (×10): 1 via ORAL
  Filled 2024-10-09 (×10): qty 1

## 2024-10-09 MED ORDER — SERTRALINE HCL 100 MG PO TABS
100.0000 mg | ORAL_TABLET | Freq: Every day | ORAL | Status: DC
  Administered 2024-10-10 – 2024-10-14 (×5): 100 mg via ORAL
  Filled 2024-10-09 (×5): qty 1

## 2024-10-09 NOTE — Group Note (Signed)
 Date:  10/09/2024 Time:  11:35 AM  Group Topic/Focus:  Physical Wellness: Support participants in improving overall well-being through guided meditation practices that promote relaxation, body awareness, and stress reduction. This group provides a structured and supportive environment where individuals can learn and practice mindfulness techniques to enhance physical health, regulate the nervous system, and develop greater awareness of the mind-body connection. Through guided meditation, participants are encouraged to reduce physical tension, improve breathing, increase focus, and cultivate habits that support long-term physical and emotional wellness.     Participation Level:  Active  Participation Quality:  Appropriate and Attentive  Affect:  Appropriate  Cognitive:  Appropriate  Insight: Appropriate  Engagement in Group:  Engaged  Modes of Intervention:  Activity  Additional Comments:  N/A  Javier Glover 10/09/2024, 11:35 AM

## 2024-10-09 NOTE — [facility] Group Notes (Signed)
 [facility] Group Notes:  (Nursing/MHT/Case Management/Adjunct)  Date:  10/09/2024  Time:  9:10 PM  Type of Therapy:  Wrap up  Participation Level:  Minimal  Participation Quality:  Resistant  Affect:  Appropriate  Cognitive:  Lacking  Insight:  Limited  Engagement in Group:  None  Modes of Intervention:  Education  Summary of Progress/Problems: Attended group, didn't participate.  Javier Glover 10/09/2024, 9:10 PM

## 2024-10-09 NOTE — [facility] Group Notes (Signed)
 [facility] Group Notes:  (Nursing/MHT/Case Management/Adjunct)  Date:  10/09/2024  Time:  9:28 PM  Type of Therapy:  Wrap up group  Participation Level:  Minimal  Participation Quality:  Resistant  Affect:  Resistant  Cognitive:  Lacking  Insight:  None  Engagement in Group:  None  Modes of Intervention:  Education  Summary of Progress/Problems:Pt. Attended , didn't participate.  Grayce LITTIE Essex 10/09/2024, 9:28 PM

## 2024-10-09 NOTE — Group Note (Signed)
 LCSW Group Therapy Note   Group Date: 10/09/2024 Start Time: 1000 End Time: 1045  Type of Therapy and Topic:  Group Therapy: Asking for Help  Participation Level:  Minimal  Description of Group: This group discussed Asking for Help in their personal lives. Patients will explore why it can be difficult to ask for help re: importance, the difference between healthy and unhealthy help, and negative and postive outcomes/responses related to asking for help. Patients will be encouraged to identify current people in their own lives who they contact for help. Patients will be encouraged to explore safe and healthy ways to make their needs known to others in their lives.  Therapeutic Goals:  1.  Patient will identify areas in their life where making their needs known could be used to improve their life.  2.  Patient will identify signs/triggers that they need to seek help.  3.  Patient will demonstrate ability to communicate their needs and identify who to contact through discussion and/or role plays  Summary of Patient Progress:    Patient present the entire group. Minimal verbiage. Pt reported his brother and baby momma help him when needed.  Therapeutic Modalities:   Cognitive Behavioral Therapy Solution-Focused Therapy  Camelia Olden, KEN 10/09/2024  12:59 PM

## 2024-10-09 NOTE — Group Note (Signed)
 Date:  10/09/2024 Time:  7:39 PM  Group Topic/Focus:  Documentary on [place] and it's impact on mental health.    Participation Level:  Active  Participation Quality:  Appropriate  Affect:  Appropriate  Cognitive:  Appropriate  Insight: Appropriate  Engagement in Group:  Engaged  Modes of Intervention:  Discussion and Education  Additional Comments:   Juliene CHRISTELLA Huddle 10/09/2024, 7:39 PM

## 2024-10-09 NOTE — Group Note (Signed)
 Date:  10/09/2024 Time:  11:09 AM  Group Topic/Focus: Social Work Group     patient did not attend social work group  [facility] 10/09/2024, 11:09 AM
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
## 2024-10-09 NOTE — Group Note (Signed)
 Date:  10/09/2024 Time:  10:09 AM  Group Topic/Focus:  Goals Group:   The focus of this group is to help patients establish daily goals to achieve during treatment and discuss how the patient can incorporate goal setting into their daily lives to aide in recovery. Orientation:   The focus of this group is to educate the patient on the purpose and policies of crisis stabilization and provide a format to answer questions about their admission.  The group details unit policies and expectations of patients while admitted.    Participation Level:  Did Not Attend    Javier Glover 10/09/2024, 10:09 AM

## 2024-10-09 NOTE — Progress Notes (Signed)
(  Sleep Hours) -7.5 as of 0530 (Any PRNs that were needed, meds refused, or side effects to meds)- prn hydroxyzine  and trazodone  given @ 2206 (Any disturbances and when (visitation, over night)-none (Concerns raised by the patient)- none (SI/HI/AVH)- Denies all

## 2024-10-09 NOTE — Progress Notes (Signed)
 [facility] Inpatient Psychiatry Progress Note  Date: 10/09/2024 Patient: Javier Glover MRN: [id number]  Assessment and Plan: Javier Glover is a 38 y.o. male  with a past psychiatric history of AUD, MDD, stimulant use disorder (amphetamine and cocaine) bipolar 1 disorder and antisocial behavior. Patient initially arrived to [facility] on 12/10 for SI and was admitted to [facility] on 12/10 for crisis stabalization and substance related issues. PMHx is significant for seizures.   12/13: No significant change in presentation. Behaviors have been appropriate. Plan to explore inpatient rehab options as this is most inline with his request for an extended inpatient program. Suspect and element of secondary gain, but he would benefit from SUD treatment.  Patient today reporting good mood and demonstrated some more range in his affect, will increase Zoloft  to 100 mg daily.  # Mood disorder - MDD vs substance induced mood disorder - Moderate to severe, with suicidal ideation - increase sertraline  to 100 mg daily   # AUD, stimulant use disorder - continue CIWA with prn ativan  - Thiamine  100 mg daily  # sore throat & cough - Continue Chloraseptic mouth spray as needed - Continue Cepacol lozenges as needed - Continue Mucinex  dm twice daily  Risk Assessment - High  Discharge Planning Barriers to discharge: suicidal ideation Estimated length of stay: 2-3 days Predicted Discharge location: Inpatient rehab  Interval History and update: No significant events overnight. Patient continues to express passive suicidal ideation, saying he continues to think about cutting his wrists.  Patient says that he just has a lot on his mind, but does not care to elaborate.  Patient says that he just feels safer here, because he knows that he is unable to hurt himself while he is here.  No other issues or concerns.  Patient is motivated to pursue residential rehab once he is given resources and  numbers to call, but concerned of finding a bed before discharge.  Patient denies any somatic issues from alcohol withdrawal, though endorses occasional hot flashes.  CIWA scores remain low, will discontinue tomorrow.   Physical Exam MSK/Neuro - Normal gait and station  Mental Status Exam Appearance - Casually dressed, appropriate hygiene and grooming  Attitude - Calm, polite, not guarded Speech - normal volume, prosody, inflection Mood - good Affect - Restricted, smiles appropiately Thought Process - LLGD Thought Content - No delusional TC expressed SI/HI - Passive SI Perceptions - Denies AVH; not RIS Judgement/Insight - [employer] of knowledge - WNL Language - No impairments  Lab Results:  Admission on 10/06/2024  Component Date Value Ref Range Status   TSH 10/07/2024 1.260  0.350 - 4.500 uIU/mL Final   Cholesterol 10/07/2024 197  0 - 200 mg/dL Final   Triglycerides 87/88/7974 88  <150 mg/dL Final   HDL 87/88/7974 45  >40 mg/dL Final   Total CHOL/HDL Ratio 10/07/2024 4.4  RATIO Final   VLDL 10/07/2024 18  0 - 40 mg/dL Final   LDL Cholesterol 10/07/2024 134 (H)  0 - 99 mg/dL Final   Hgb J8r MFr Bld 10/07/2024 5.8 (H)  4.8 - 5.6 % Final   Mean Plasma Glucose 10/07/2024 119.76  mg/dL Final    Vitals: Blood pressure 125/81, pulse 96, temperature 98.3 F (36.8 C), temperature source Oral, resp. rate 16, height 5' 9 (1.753 m), weight 85.3 kg, SpO2 99%.   Alfornia Light, DO

## 2024-10-09 NOTE — Group Note (Signed)
 Date:  10/09/2024 Time:  10:42 AM  Group Topic/Focus:  Social Wellness; [employer], enhance communication skills, and promote a sense of belonging among participants. Through supportive group activities, discussions, and shared experiences, the group encourages healthy relationships, mutual respect, and social engagement to improve overall well-being and community connectedness.  Participation Level:  Did Not Attend  Vena Mais 10/09/2024, 10:42 AM

## 2024-10-09 NOTE — Progress Notes (Signed)
°   10/09/24 2122  Psych Admission Type (Psych Patients Only)  Admission Status Voluntary  Psychosocial Assessment  Patient Complaints Anxiety  Eye Contact Fair  Facial Expression Animated  Affect Appropriate to circumstance  Speech Logical/coherent  Interaction Assertive  Motor Activity Other (Comment) (WDL)  Appearance/Hygiene Unremarkable  Behavior Characteristics Appropriate to situation  Mood Labile  Thought Process  Coherency Concrete thinking  Content WDL  Delusions None reported or observed  Perception WDL  Hallucination None reported or observed  Judgment Poor  Confusion None  Danger to Self  Current suicidal ideation? Denies  Self-Injurious Behavior No self-injurious ideation or behavior indicators observed or expressed   Agreement Not to Harm Self Yes  Description of Agreement verbal  Danger to Others  Danger to Others None reported or observed

## 2024-10-10 LAB — RESP PANEL BY RT-PCR (RSV, FLU A&B, COVID)  RVPGX2
Influenza A by PCR: NEGATIVE
Influenza B by PCR: NEGATIVE
Resp Syncytial Virus by PCR: NEGATIVE
SARS Coronavirus 2 by RT PCR: NEGATIVE

## 2024-10-10 MED ORDER — ONDANSETRON 4 MG PO TBDP
4.0000 mg | ORAL_TABLET | Freq: Four times a day (QID) | ORAL | Status: DC | PRN
  Administered 2024-10-10: 4 mg via ORAL

## 2024-10-10 MED ORDER — CALCIUM CARBONATE ANTACID 500 MG PO CHEW
1.0000 | CHEWABLE_TABLET | Freq: Once | ORAL | Status: AC | PRN
  Administered 2024-10-10: 200 mg via ORAL
  Filled 2024-10-10: qty 1

## 2024-10-10 MED ORDER — ONDANSETRON 4 MG PO TBDP
ORAL_TABLET | ORAL | Status: AC
  Filled 2024-10-10: qty 1

## 2024-10-10 MED ORDER — PANTOPRAZOLE SODIUM 40 MG PO TBEC
40.0000 mg | DELAYED_RELEASE_TABLET | Freq: Every day | ORAL | Status: DC
  Administered 2024-10-11: 08:00:00 40 mg via ORAL
  Filled 2024-10-10: qty 1

## 2024-10-10 NOTE — Progress Notes (Signed)
 Adult Psychoeducational Group Note  Date:  10/10/2024 Time:  8:46 PM  Group Topic/Focus:  Wrap-Up Group:   The focus of this group is to help patients review their daily goal of treatment and discuss progress on daily workbooks.  Participation Level:  Active  Participation Quality:  Appropriate  Affect:  Appropriate  Cognitive:  Appropriate  Insight: Appropriate  Engagement in Group:  Engaged  Modes of Intervention:  Discussion  Additional Comments:  Pt stated he had a good day, but did not have a goal for the day.  Daine Pillar D 10/10/2024, 8:46 PM

## 2024-10-10 NOTE — Progress Notes (Signed)
°   10/10/24 2016  Psych Admission Type (Psych Patients Only)  Admission Status Voluntary  Psychosocial Assessment  Patient Complaints Anxiety;Depression  Eye Contact Fair  Facial Expression Animated  Affect Appropriate to circumstance  Speech Logical/coherent  Interaction Assertive  Motor Activity Other (Comment) (WDL)  Appearance/Hygiene Unremarkable  Behavior Characteristics Appropriate to situation  Mood Labile  Thought Process  Coherency Concrete thinking  Content WDL  Delusions None reported or observed  Perception WDL  Hallucination None reported or observed  Judgment Poor  Confusion None  Danger to Self  Current suicidal ideation? Passive  Description of Suicide Plan no plan  Self-Injurious Behavior No self-injurious ideation or behavior indicators observed or expressed   Agreement Not to Harm Self Yes  Description of Agreement verbal  Danger to Others  Danger to Others None reported or observed

## 2024-10-10 NOTE — Progress Notes (Signed)
(  Sleep Hours) - 9.25 hours (Any PRNs that were needed, meds refused, or side effects to meds)-  Trazodone , Vistaril  given (Any disturbances and when (visitation, over night)- None (Concerns raised by the patient)-  None (SI/HI/AVH)-  Denies

## 2024-10-10 NOTE — Progress Notes (Signed)
°   10/10/24 1500  Psych Admission Type (Psych Patients Only)  Admission Status Voluntary  Psychosocial Assessment  Patient Complaints Anxiety;Depression  Eye Contact Fair  Facial Expression Animated  Affect Appropriate to circumstance  Speech Logical/coherent  Interaction Assertive  Motor Activity Other (Comment) (level 3 observation)  Appearance/Hygiene Unremarkable  Behavior Characteristics Appropriate to situation  Mood Labile  Thought Process  Coherency Concrete thinking  Content WDL  Delusions None reported or observed  Perception WDL  Hallucination None reported or observed  Judgment Poor  Confusion None  Danger to Self  Current suicidal ideation? Passive  Self-Injurious Behavior No self-injurious ideation or behavior indicators observed or expressed   Agreement Not to Harm Self Yes  Description of Agreement agreed to contact staff before acting on harmful thoughts  Danger to Others  Danger to Others None reported or observed

## 2024-10-10 NOTE — Plan of Care (Signed)
   Problem: Education: Goal: Verbalization of understanding the information provided will improve Outcome: Progressing   Problem: Activity: Goal: Interest or engagement in activities will improve Outcome: Progressing

## 2024-10-10 NOTE — Plan of Care (Signed)
   Problem: Coping: Goal: Ability to verbalize frustrations and anger appropriately will improve Outcome: Progressing Goal: Ability to demonstrate self-control will improve Outcome: Progressing

## 2024-10-10 NOTE — Group Note (Signed)
 Date:  10/10/2024 Time:  7:39 PM  Group Topic/Focus:  Self Care:   The focus of this group is to help patients understand the importance of self-care in order to improve or restore emotional, physical, spiritual, interpersonal, and financial health. Time management/priority setting.    Participation Level:  Did Not Attend   Javier Glover 10/10/2024, 7:39 PM
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
## 2024-10-10 NOTE — Progress Notes (Signed)
 Note Type: Client Interaction  Per patient request, this clinical research associate provided a list of substance use facilities for the patient to consider for treatment upon discharge. Patient understood and agreeable.  SIGNED: Treylon Henard Nunez-Uva, LCSW-A

## 2024-10-11 MED ORDER — CALCIUM CARBONATE ANTACID 500 MG PO CHEW: 1.0000 | CHEWABLE_TABLET | ORAL | Status: AC | PRN

## 2024-10-11 MED ORDER — CALCIUM CARBONATE ANTACID 500 MG PO CHEW
1.0000 | CHEWABLE_TABLET | Freq: Every day | ORAL | Status: AC | PRN
  Administered 2024-10-11: 09:00:00 200 mg via ORAL
  Filled 2024-10-11: qty 1

## 2024-10-11 MED ORDER — PANTOPRAZOLE SODIUM 40 MG PO TBEC
40.0000 mg | DELAYED_RELEASE_TABLET | Freq: Two times a day (BID) | ORAL | Status: DC
  Administered 2024-10-11 – 2024-10-14 (×6): 40 mg via ORAL
  Filled 2024-10-11 (×6): qty 1

## 2024-10-11 MED ORDER — ENSURE PLUS HIGH PROTEIN PO LIQD
237.0000 mL | Freq: Two times a day (BID) | ORAL | Status: DC
  Administered 2024-10-11 – 2024-10-14 (×7): 237 mL via ORAL
  Filled 2024-10-11 (×8): qty 237

## 2024-10-11 MED ORDER — CLONIDINE HCL 0.1 MG PO TABS
0.2000 mg | ORAL_TABLET | Freq: Every day | ORAL | Status: DC
  Administered 2024-10-11 – 2024-10-13 (×3): 0.2 mg via ORAL
  Filled 2024-10-11 (×3): qty 2

## 2024-10-11 NOTE — Progress Notes (Signed)
 [facility] Inpatient Psychiatry Progress Note  Date: 10/11/2024 Patient: Javier Glover MRN: [id number]  Assessment and Plan: Javier Glover is a 38 y.o. male  with a past psychiatric history of AUD, MDD, stimulant use disorder (amphetamine and cocaine) bipolar 1 disorder and antisocial behavior. Patient initially arrived to [facility] on 12/10 for SI and was admitted to [facility] on 12/10 for crisis stabalization and substance related issues. PMHx is significant for seizures.   12/15: No significant change in presentation, patient still chronically suicidal with plan to cut his wrist. Behaviors have been appropriate for the most part. Plan to explore inpatient rehab options as this is most inline with his request for an extended inpatient program. Suspect and element of secondary gain, but he would benefit from SUD treatment.  Patient will call rehabilitation places today and work on placement with expected discharge tomorrow or to shelter Wednesday if he is unable to obtain placement.   # Mood disorder - MDD vs substance induced mood disorder - Moderate to severe, with suicidal ideation - continue sertraline  100 mg daily   # AUD, stimulant use disorder - Thiamine  100 mg daily  # sore throat & cough - Continue Chloraseptic mouth spray as needed - Continue Cepacol lozenges as needed - Continue Mucinex  dm twice daily - respiratory panel -> negative  #GERD - Continue pantoprazole  40 mg daily - Continue Tums as needed daily  Risk Assessment - High  Discharge Planning Barriers to discharge: suicidal ideation Estimated length of stay: 1-2 days Predicted Discharge location: Residential rehab vs shelter  Interval History and update: Patient continues to express passive suicidal ideation.  Per nursing staff, patient was shaving this morning when he expressed suicidal thoughts to cut his wrist with a razor, and has had shaving privileges revoked.  Patient denies side  effects from medication, though endorses hot flashes at night.  Patient will call rehabilitation places today and work on bed placement with expected discharge Tuesday or Wednesday.  Patient initially agreeable with this plan, though came to physician saying he will hurt himself if he leaves this facility.  Discussed with patient that we do not want him to do that, and we are hoping that attending group activities has been helpful to learn coping skills with the suicidal thoughts.  Patient says I can end up dead out there, though we discussed we are trying to help him get to a safe place after this hospitalization. Patient then became agitated and says you are not trying to help me, you do not care about anyone, I do not like your attitude to the attending physician.   Physical Exam MSK/Neuro - Normal gait and station  Mental Status Exam Appearance - Casually dressed, appropriate hygiene and grooming  Attitude - Calm, polite, not guarded Speech - normal volume, tone, inflection Mood - irritable Affect - Restricted Thought Process - LLGD Thought Content - No delusional TC expressed SI/HI - Passive SI Perceptions - Denies AVH; not RIS Judgement/Insight - Fair Fund of knowledge - WNL Language - No impairments  Lab Results:  Admission on 10/06/2024  Component Date Value Ref Range Status   TSH 10/07/2024 1.260  0.350 - 4.500 uIU/mL Final   Cholesterol 10/07/2024 197  0 - 200 mg/dL Final   Triglycerides 87/88/7974 88  <150 mg/dL Final   HDL 87/88/7974 45  >40 mg/dL Final   Total CHOL/HDL Ratio 10/07/2024 4.4  RATIO Final   VLDL 10/07/2024 18  0 - 40 mg/dL Final  LDL Cholesterol 10/07/2024 134 (H)  0 - 99 mg/dL Final   Hgb J8r MFr Bld 10/07/2024 5.8 (H)  4.8 - 5.6 % Final   Mean Plasma Glucose 10/07/2024 119.76  mg/dL Final   SARS Coronavirus 2 by RT PCR 10/10/2024 NEGATIVE  NEGATIVE Final   Influenza A by PCR 10/10/2024 NEGATIVE  NEGATIVE Final   Influenza B by PCR 10/10/2024 NEGATIVE   NEGATIVE Final   Resp Syncytial Virus by PCR 10/10/2024 NEGATIVE  NEGATIVE Final    Vitals: Blood pressure 139/81, pulse 86, temperature 98.3 F (36.8 C), temperature source Oral, resp. rate 18, height 5' 9 (1.753 m), weight 85.3 kg, SpO2 99%.   Alfornia Light, DO

## 2024-10-11 NOTE — Plan of Care (Signed)
   Problem: Education: Goal: Knowledge of [facility] Education information/materials will improve Outcome: Progressing Goal: Emotional status will improve Outcome: Progressing Goal: Mental status will improve Outcome: Progressing Goal: Verbalization of understanding the information provided will improve Outcome: Progressing

## 2024-10-11 NOTE — Group Note (Signed)
 Date:  10/11/2024 Time:  9:40 AM  Group Topic/Focus:  Goals Group:   The focus of this group is to help patients establish daily goals to achieve during treatment and discuss how the patient can incorporate goal setting into their daily lives to aide in recovery. Orientation:   The focus of this group is to educate the patient on the purpose and policies of crisis stabilization and provide a format to answer questions about their admission.  The group details unit policies and expectations of patients while admitted.    Participation Level:  Active  Participation Quality:  Appropriate  Affect:  Appropriate  Cognitive:  Appropriate  Insight: Good  Engagement in Group:  Lacking  Modes of Intervention:  Activity and Discussion  Additional Comments:  N/A  Lauris JONELLE Morales 10/11/2024, 9:40 AM

## 2024-10-11 NOTE — Plan of Care (Signed)

## 2024-10-11 NOTE — Group Note (Signed)
 Date:  10/11/2024 Time:  10:15 AM  Group Topic/Focus: Recreational Therapy    Pt did attend recreational therapy group  Javier Glover R Wyat Infinger 10/11/2024, 10:15 AM

## 2024-10-11 NOTE — Group Note (Signed)
 Occupational Therapy Group Note   Group Topic:Goal Setting  Group Date: 10/11/2024 Start Time: 1500 End Time: 1544 Facilitators: Dot [place] MATSU, OT   Group Description: Group encouraged engagement and participation through discussion focused on goal setting. Group members were introduced to goal-setting using the SMART Goal framework, identifying goals as Specific, Measureable, Acheivable, Relevant, and Time-Bound. Group members took time from group to create their own personal goal reflecting the SMART goal template and shared for review by peers and OT.    Therapeutic Goal(s):  Identify at least one goal that fits the SMART framework    Participation Level: Engaged   Participation Quality: Independent   Behavior: Appropriate   Speech/Thought Process: Relevant   Affect/Mood: Appropriate   Insight: Fair   Judgement: Fair      Modes of Intervention: Education  Patient Response to Interventions:  Attentive   Plan: Continue to engage patient in OT groups 2 - 3x/week.  10/11/2024  [place] MATSU Dot, OT  Javier Glover, OT

## 2024-10-11 NOTE — Progress Notes (Signed)
(  Sleep Hours) - 8.25 hours (Any PRNs that were needed, meds refused, or side effects to meds)-  Vistaril , Trazodone , Maalox, and TUMS given (Any disturbances and when (visitation, over night)- None (Concerns raised by the patient)-  Continued heartburn, GERD - NP notified and new orders for TUMS and Protonix  received (SI/HI/AVH)-    Passive SI, contracts for safety

## 2024-10-11 NOTE — Group Note (Signed)
 Date:  10/11/2024 Time:  12:52 PM  Group Topic/Focus:  Emotional Wellness: explore and understand the deeper layers of anger by using the anger iceberg model. This metaphor helps us  recognize that anger, like an iceberg, often only shows a small portion of what's truly beneath the surface. By delving into this concept, participants will develop a more nuanced view of their emotional experiences, enhancing self-awareness and promoting healthier emotional expression. Physical Wellness: educate participants on the vital role that sleep plays in overall physical wellness by watching a TED Talk on the science of sleep and engaging in a thoughtful discussion afterward. The session will focus on how sleep impacts various aspects of health, including cognitive function, physical recovery, immune health, and emotional well-being, and how participants can incorporate better sleep practices into their lives to improve their physical wellness.  Participation Level:  Active  Participation Quality:  Appropriate  Affect:  Appropriate  Cognitive:  Appropriate  Insight: Appropriate  Engagement in Group:  Engaged  Modes of Intervention:  Discussion and Education  Additional Comments:  N/A  Javier Glover 10/11/2024, 12:52 PM

## 2024-10-11 NOTE — BHH Group Notes (Signed)
 BHH Group Notes:  (Nursing/MHT/Case Management/Adjunct)  Date:  10/11/2024  Time:  8:09 PM  Type of Therapy:  AA group  Participation Level:  Active  Participation Quality:  Appropriate  Affect:  Appropriate  Cognitive:  Appropriate  Insight:  Appropriate  Engagement in Group:  Engaged  Modes of Intervention:  Education  Summary of Progress/Problems: Attended AA meeting.  Javier Glover 10/11/2024, 8:09 PM

## 2024-10-11 NOTE — Group Note (Signed)
 Recreation Therapy Group Note   Group Topic:Team Building  Group Date: 10/11/2024 Start Time: 0932 End Time: 1007 Facilitators: Harshan Kearley-McCall, LRT,CTRS Location: [address] Dayroom   Group Topic: Communication, Team Building, Problem Solving  Goal Area(s) Addresses:  Patient will effectively work with peer towards shared goal.  Patient will identify skills used to make activity successful.  Patient will identify how skills used during activity can be used to reach post d/c goals.   Behavioral Response: Minimal  Intervention: STEM Activity  Activity: Landing Pad. In teams of 3-5, patients were given 12 plastic drinking straws and an equal length of masking tape. Using the materials provided, patients were asked to build a landing pad to catch a golf ball dropped from approximately 5 feet in the air. All materials were required to be used by the team in their design. LRT facilitated post-activity discussion.  Education: Pharmacist, Community, Scientist, Physiological, Discharge Planning   Education Outcome: Acknowledges education/In group clarification offered/Needs additional education.    Affect/Mood: Appropriate   Participation Level: Minimal   Participation Quality: Independent   Behavior: Appropriate   Speech/Thought Process: Relevant   Insight: Moderate   Judgement: Moderate   Modes of Intervention: STEM Activity   Patient Response to Interventions:  Attentive   Education Outcome:  In group clarification offered    Clinical Observations/Individualized Feedback: Pt was social and interactive with peers. Pt didn't really engage in activity. Pt did make some suggestions to his peers at times while they were working on their landing pad.     Plan: Continue to engage patient in RT group sessions 2-3x/week.   Evanny Ellerbe-McCall, LRT,CTRS 10/11/2024 11:39 AM

## 2024-10-11 NOTE — Progress Notes (Signed)
°   10/11/24 1400  Psych Admission Type (Psych Patients Only)  Admission Status Voluntary  Psychosocial Assessment  Patient Complaints Self-harm thoughts  Eye Contact Fair  Facial Expression Animated  Affect Appropriate to circumstance  Speech Logical/coherent  Interaction Assertive  Motor Activity Other (Comment) (WNL)  Appearance/Hygiene Unremarkable  Behavior Characteristics Appropriate to situation  Mood Labile  Thought Process  Coherency Concrete thinking  Content WDL  Delusions None reported or observed  Perception WDL  Hallucination None reported or observed  Judgment Poor  Confusion None  Danger to Self  Current suicidal ideation? Passive  Description of Suicide Plan plan to cut wrists while saving here (we will not let patient shave, MD notified)  Self-Injurious Behavior Self-injurious ideation verbalized  Agreement Not to Harm Self Yes  Description of Agreement verbal  Danger to Others  Danger to Others None reported or observed

## 2024-10-11 NOTE — B[facility] Suicide Risk Assessment (Signed)
 B[facility] INPATIENT:  Family/Significant Other Suicide Prevention Education  Suicide Prevention Education:  Contact Attempts: Javier Glover (p.o.) [phone], (name of family member/significant other) has been identified by the patient as the family member/significant other with whom the patient will be residing, and identified as the person(s) who will aid the patient in the event of a mental health crisis.  With written consent from the patient, two attempts were made to provide suicide prevention education, prior to and/or following the patient's discharge.  We were unsuccessful in providing suicide prevention education.  A suicide education pamphlet was given to the patient to share with family/significant other.  Date and time of first attempt:10/08/24/1:35pm Date and time of second attempt:10/10/24/3:23pm  Javier Glover 10/11/2024, 1:07 PM

## 2024-10-11 NOTE — Group Note (Signed)
 Date:  10/11/2024 Time:  3:56 PM  Group Topic/Focus: Occupational Therapy    Pt did attend occupational therapy group  Shaun Zuccaro R Khia Dieterich 10/11/2024, 3:56 PM

## 2024-10-11 NOTE — Progress Notes (Signed)
 Note Type: Patient Interaction  Patient verbalized to this writer that he is NOT interested in going to [facility] for treatment upon discharge. Patient requested a referral to [facility].   SIGNED: Tymia Streb Nunez-Uva, LCSW-A

## 2024-10-11 NOTE — Group Note (Signed)
 Date:  10/11/2024 Time:  3:09 PM  Group Topic/Focus: Grief and Loss Group    Pt did attend grief and loss group with the chaplain  Tashari Schoenfelder R Sharnae Winfree 10/11/2024, 3:09 PM

## 2024-10-11 NOTE — Progress Notes (Signed)
 CONTACT NOTE:  Javier Glover [phone]  This writer spoke with Karleen from [facility] in regards to patient's possible acceptance to their substance use residential tx program. Karleen confirmed the patient has been accepted, and can arrive on 10/13/24 for treatment. Patient will need at least 7 day medication samples + paper scripts as they do not provide medications.   Karleen stated [facility] is located at [address]. [place], [place] [address].  SIGNED: Thomos Domine Nunez-Uva, LCSW-A

## 2024-10-12 NOTE — Progress Notes (Signed)
 [facility] Inpatient Psychiatry Progress Note  Date: 10/12/2024 Patient: Javier Glover MRN: [id number]  Assessment and Plan: OZRO RUSSETT is a 38 y.o. male  with a past psychiatric history of AUD, MDD, stimulant use disorder (amphetamine and cocaine) bipolar 1 disorder and antisocial behavior. Patient initially arrived to [facility] on 12/10 for SI and was admitted to [facility] on 12/10 for crisis stabalization and substance related issues. Plan to explore inpatient rehab options as this is most inline with his request for an extended inpatient program. Suspect and element of secondary gain, but he would benefit from SUD treatment.   12/16: No significant change in presentation on assessment, thought patient observed with bright affect, laughing appropriately in the milieu, inconsistent with patient self-report of mood. Patient chronically suicidal with plan to cut his wrist. This patient has been offered various housing resources, (which he turns down), has been attending therapeutic groups to work on coping skills to manage SI, and has been actively participative in his treatment plan.  Patient continues to complain of reflux, increased pantoprazole  to twice daily, and started clonidine  for nocturnal hot flashes.  We will continue to work with patient and LCSW to arrange placement at residential rehab or return to shelter.  # Mood disorder - MDD vs substance induced mood disorder - Moderate to severe, with suicidal ideation - continue sertraline  100 mg daily   # AUD, stimulant use disorder - Thiamine  100 mg daily  # sore throat & cough - Continue Chloraseptic mouth spray as needed - Continue Cepacol lozenges as needed - Continue Mucinex  dm twice daily - respiratory panel -> negative  # GERD - Increase pantoprazole  to 40 mg twice daily - Continue Tums as needed daily  # nocturnal hot flashes - Continue 0.2 clonidine  QHS  Risk Assessment - High  Discharge  Planning Barriers to discharge: dispo planning Estimated length of stay: 1 day Predicted Discharge location: Residential rehab vs shelter  Interval History and update: Patient has been able to secure admission to [facility] tomorrow, however is now resistant to this but not citing adequate reasoning.  Patient states that he would much rather prefer [facility] or caring services, however these facilities will not accept him due to insurance.  Discussed with patient that he will need to return to the shelter he was staying, or pursue with [facility] residential rehabilitation, when he discusses worsening of his suicidal ideation.  Attempted to discuss with patient the worsening of his SI, asked if he is experiencing more frequent thoughts, coming up with different plans, however patient is unable to remain attentive during my questioning, only focusing on the attending physician.  Patient continues to say I just have a little my mind with my family though still does not want to expand on this.  Discussed with patient that it would be difficult to resolve some of these thoughts he is having if he is unable to disclose them to his physicians, though patient still reluctant to share.   Physical Exam MSK/Neuro - Normal gait and station  Mental Status Exam Appearance - Casually dressed, appropriate hygiene and grooming  Attitude - Calm, polite, not guarded Speech - normal volume, tone, inflection Mood - labile Affect - bright, laughing appropriately with peers in the milieu Thought Process - LLGD Thought Content - No delusional TC expressed SI/HI - Passive SI Perceptions - Denies AVH; not RIS Judgement/Insight - Fair Fund of knowledge - WNL Language - No impairments  Lab Results:  Admission on 10/06/2024  Component Date Value Ref Range Status   TSH 10/07/2024 1.260  0.350 - 4.500 uIU/mL Final   Cholesterol 10/07/2024 197  0 - 200 mg/dL Final   Triglycerides 87/88/7974 88  <150 mg/dL Final   HDL  87/88/7974 45  >40 mg/dL Final   Total CHOL/HDL Ratio 10/07/2024 4.4  RATIO Final   VLDL 10/07/2024 18  0 - 40 mg/dL Final   LDL Cholesterol 10/07/2024 134 (H)  0 - 99 mg/dL Final   Hgb J8r MFr Bld 10/07/2024 5.8 (H)  4.8 - 5.6 % Final   Mean Plasma Glucose 10/07/2024 119.76  mg/dL Final   SARS Coronavirus 2 by RT PCR 10/10/2024 NEGATIVE  NEGATIVE Final   Influenza A by PCR 10/10/2024 NEGATIVE  NEGATIVE Final   Influenza B by PCR 10/10/2024 NEGATIVE  NEGATIVE Final   Resp Syncytial Virus by PCR 10/10/2024 NEGATIVE  NEGATIVE Final    Vitals: Blood pressure 123/74, pulse 86, temperature (!) 97.3 F (36.3 C), temperature source Oral, resp. rate 16, height 5' 9 (1.753 m), weight 85.3 kg, SpO2 100%.   Alfornia Light, DO

## 2024-10-12 NOTE — Group Note (Signed)
 Date:  10/12/2024 Time:  10:03 AM  Group Topic/Focus: Goals orientation Goals Group:   The focus of this group is to help patients establish daily goals to achieve during treatment and discuss how the patient can incorporate goal setting into their daily lives to aide in recovery. Orientation:   The focus of this group is to educate the patient on the purpose and policies of crisis stabilization and provide a format to answer questions about their admission.  The group details unit policies and expectations of patients while admitted.    Participation Level:  Active  Participation Quality:  Appropriate  Affect:  Appropriate  Cognitive:  Alert and Appropriate  Insight: Appropriate  Engagement in Group:  Engaged  Modes of Intervention:  Discussion and Orientation  Additional Comments:    Javier Glover 10/12/2024, 10:03 AM

## 2024-10-12 NOTE — Group Note (Signed)
 Date:  10/12/2024 Time:  3:47 PM  Group Topic/Focus: Sleep Hygiene Dimensions of Wellness:   The focus of this group is to introduce the topic of wellness and discuss the role each dimension of wellness plays in total health.    Participation Level:  Active  Participation Quality:  Appropriate  Affect:  Appropriate  Cognitive:  Appropriate  Insight: Appropriate  Engagement in Group:  Engaged  Modes of Intervention:  Discussion  Additional Comments:  Pt engaged appropriately during group  Shanda D Adriena Manfre 10/12/2024, 3:47 PM

## 2024-10-12 NOTE — Plan of Care (Signed)
   Problem: Education: Goal: Knowledge of [facility] Education information/materials will improve Outcome: Progressing Goal: Emotional status will improve Outcome: Progressing Goal: Mental status will improve Outcome: Progressing Goal: Verbalization of understanding the information provided will improve Outcome: Progressing

## 2024-10-12 NOTE — Group Note (Unsigned)
 Date:  10/12/2024 Time:  10:03 PM  Group Topic/Focus:  Wrap-Up Group:   The focus of this group is to help patients review their daily goal of treatment and discuss progress on daily workbooks.    Participation Level:  {BHH PARTICIPATION OZCZO:77735}  Participation Quality:  {BHH PARTICIPATION QUALITY:22265}  Affect:  {BHH AFFECT:22266}  Cognitive:  {BHH COGNITIVE:22267}  Insight: {BHH Insight2:20797}  Engagement in Group:  {BHH ENGAGEMENT IN HMNLE:77731}  Modes of Intervention:  {BHH MODES OF INTERVENTION:22269}  Additional Comments:  ***  Bari Moats 10/12/2024, 10:03 PM

## 2024-10-12 NOTE — Group Note (Signed)
 Date:  10/12/2024 Time:  4:12 PM  Group Topic/Focus: social work group on grief Social work with grieving adults in mental health settings focuses on supporting individuals as they cope with loss while addressing the emotional, psychological, and social impacts of grief. Social workers assess the nature of the loss, the clients coping abilities, support systems, and any co-occurring mental health concerns such as depression or anxiety. Using a strengths-based and culturally sensitive approach, they provide emotional support, normalize grief reactions, offer psychoeducation, and apply therapeutic interventions to help clients process their feelings and adjust to life changes. The goal is not to eliminate grief, but to promote healthy adaptation, resilience, and improved mental well-being.    Participation Level:  Active   Javier Glover 10/12/2024, 4:12 PM

## 2024-10-12 NOTE — Progress Notes (Signed)
°   10/12/24 0857  Psych Admission Type (Psych Patients Only)  Admission Status Voluntary  Psychosocial Assessment  Patient Complaints Anxiety;Depression;Self-harm thoughts  Eye Contact Brief  Facial Expression Animated  Affect Anxious  Speech Logical/coherent  Interaction Assertive  Motor Activity Other (Comment) (WDL)  Appearance/Hygiene Unremarkable  Behavior Characteristics Anxious  Mood Depressed;Anxious  Thought Process  Coherency WDL  Content WDL  Delusions None reported or observed  Perception WDL  Hallucination None reported or observed  Judgment Impaired  Confusion None  Danger to Self  Current suicidal ideation? Passive  Description of Suicide Plan plan to cut wrists while saving here  we will not let patient shave, MD notified. This morning pt states I cant find anything to do it with in the hospital. MHT on hall informed not to allow pt to shave.  Self-Injurious Behavior Self-injurious ideation verbalized  Agreement Not to Harm Self Yes  Description of Agreement Verbal; Pt agreed to approach staff if he plans to act on thoughts to hurt self  Danger to Others  Danger to Others None reported or observed

## 2024-10-12 NOTE — B[facility] Group Notes (Signed)
 Spirituality Group   Group Goal: Support / Education around grief and loss   Group Description: Following introductions and group rules, group members engaged in facilitated group dialog and support around topic of loss, with particular support around experiences of loss in their lives. Group members identified types of loss (relationships / self / things) as well as patterns, circumstances, and changes that precipitate loss. Reflection invited on thoughts / feelings around loss, normalized grief responses, and recognized variety in grief experience. Group noted Worden's four tasks of grief in discussion. Group drew on Adlerian / Rogerian, narrative, MI, with [place] group therapy as a primary framework.   Observations: Javier Glover was an active participant in the group.  Jeptha Hinnenkamp L. Javier Glover HERO.Div

## 2024-10-12 NOTE — Progress Notes (Signed)
 CONTACT NOTE:  Officer Bridgette [phone]  This writer left a engineer, technical sales for Officer Dodson to attempt to inform them of patient's whereabouts. This clinical research associate provided call back number of [phone]. This clinical research associate did not leave any personal information of the patient's such as patient's name, DOB, or other private information.  SIGNED: Yasser Hepp Nunez-Uva, LCSW-A

## 2024-10-12 NOTE — Group Note (Signed)
 LCSW Group Therapy Note   Group Date: 10/12/2024 Start Time: 1100 End Time: 1200   Participation:  patient was present for half of the group session.  He listened but didn't participate in the discussion.  At times, he was disruptive, attempting to talk to another patient.  Type of Therapy:  Group Therapy  Topic:  Healing Hearts:  A Safe Space for Grief     Objective:   The objective of this class is to create a compassionate environment where participants can process their grief, explore different stages of grief, and discover ways to honor their loved ones through personal rituals.  3 Goals: Provide a safe and supportive space where participants feel comfortable sharing their feelings and experiences of grief without judgment. Educate participants about the stages of grief and emphasize that there is no right way to grieve or a fixed timeline for healing. Introduce the concept of rituals as a means to process grief, allowing individuals to honor their loved ones in a personal and meaningful way.  Summary:  In Healing Hearts: A Safe Space for Grief, we explored the unique and personal journey of grief, emphasizing that everyone experiences it differently.  We discussed the five stages of grief (denial, anger, bargaining, depression, and acceptance), with the understanding that grief is not linear.  Rituals were introduced as a way to help cope with loss, offering comfort and connection through meaningful actions such as lighting candles or taking memory walks. Participants were encouraged to express their emotions, focus on self-care, and reflect on moments of gratitude for their loved ones, recognizing that healing is a process and there is no timeline for grief.  Therapeutic Modalities: Elements of CBT: Challenge thoughts, reframe beliefs, self-compassion Elements of DBT: Mindfulness, distress tolerance, emotion regulation Supportive Therapy:  Provide validation, foster a safe and  supportive group environment, normalize grief    Catherene MALVA Dynes, LCSWA 10/12/2024  12:20 PM

## 2024-10-12 NOTE — Group Note (Signed)
 Recreation Therapy Group Note   Group Topic:Animal Assisted Therapy   Group Date: 10/12/2024 Start Time: 0945 End Time: 1030 Facilitators: Carnetta Losada-McCall, LRT,CTRS Location: [address] [employer]   AAA/T Program Assumption of Risk Form signed by Patient/ or Parent Legal Guardian Yes  Patient understands his/her participation is voluntary Yes  Behavioral Response:    Education: Charity Fundraiser, Appropriate Animal Interaction   Education Outcome: Acknowledges education.    Clinical Observations/Individualized Feedback: Pet therapy didn't take place due medical situation with handlers older dog.      Plan: Continue to engage patient in RT group sessions 2-3x/week.   Mekaila Tarnow-McCall, LRT,CTRS 10/12/2024 11:46 AM

## 2024-10-12 NOTE — Progress Notes (Signed)
 CONTACT NOTE:  [facility] [phone]  This writer spoke with Jaycee (intake coordinator) from [facility] who stated their facility does not accept patient's insurance for their tx facility.  SIGNED: Davarious Tumbleson Nunez-Uva, LCSW-A

## 2024-10-12 NOTE — Group Note (Signed)
 Date:  10/12/2024 Time:  4:56 PM  Group Topic/Focus: Ice Breaker A simple and effective icebreaker for adult group therapy is the [employer], where each participant shares one word that describes how they are feeling at the start of the session. This activity creates a low-pressure opportunity for self-expression, helps normalize a range of emotions, and encourages emotional awareness without requiring participants to disclose more than they are comfortable sharing. It also allows the facilitator to quickly gauge the groups emotional state and sets a supportive, respectful tone for the session, making it especially suitable for mental health groups involving anxiety, depression, or grief.    Participation Level:  Active  Participation Quality:  Appropriate  Affect:  Appropriate  Cognitive:  Alert  Insight: Appropriate  Engagement in Group:  Engaged  Modes of Intervention:  Discussion  Additional Comments:    Javier Glover 10/12/2024, 4:56 PM

## 2024-10-12 NOTE — Progress Notes (Signed)
 CONTACT NOTE:   Damien Dayhoff (p.o.) [phone]  Officer Emusic left a voicemail on this writer's machine stating she attempted to return a phone call. Officer Emusic stated she will be in court after 9:30am and requested for this writer to return a call after 12:30pm.  SIGNED: Louetta Lame, LCSW-A

## 2024-10-12 NOTE — Progress Notes (Signed)
(  Sleep Hours) -7.75 as of 0530 (Any PRNs that were needed, meds refused, or side effects to meds)- prn tylenol  @ 2121 for tooth ache, hydroxyzine  and trazodone  @ 2120 (Any disturbances and when (visitation, over night)-none (Concerns raised by the patient)- none (SI/HI/AVH)- denies all this shift

## 2024-10-12 NOTE — Group Note (Signed)
 Date:  10/12/2024 Time:  11:24 AM  Group Topic/Focus: Holiday Music Holiday music therapy supports adult mental health by reducing stress and anxiety, improving mood, fostering emotional expression, and easing loneliness during a season that can intensify both joy and distress. Through guided listening, gentle singing, lyric discussion, or simple instrument play, adults can process emotions, access positive memories, and experience grounding and connection in a nonverbal, low-pressure way. When used with choice and cultural sensitivity, holiday music therapy is especially helpful for adults coping with depression, anxiety, grief, trauma, or social isolation, making it a valuable, trauma-informed therapeutic approach during the holidays.    Participation Level:  Did Not Attend  Sharelle laws 10/12/2024, 11:24 AM

## 2024-10-12 NOTE — Progress Notes (Signed)
 CONTACT NOTE:  Environmental Manager [facility] P.O) [phone]  This writer spoke with Officer Emusic regarding this patient's location (per patient request). Officer Emusic stated this patient has been requested to transfer to her but has not been officially approved as she has not been able to establish contact with the patient. As of this moment, the patient is still on parole under Officer Bridgette [employer] and will have to reach out to her first. Environmental Manager stated the patient cannot leave [facility] for substance use tx until the established officer approves it. Officer Emusic provided the following contacts below:  Surveyor, Quantity cell: [phone] Officer Dodson office number: [phone] ext. [phone]  Officer Dodson's email: [email or web address] Officer Dodson's supervisor, Musician: [phone]   SIGNED: Louetta Lame, LCSW-A

## 2024-10-13 ENCOUNTER — Encounter (HOSPITAL_COMMUNITY): Payer: Self-pay

## 2024-10-13 MED ORDER — BENZOCAINE 10 % MT GEL: Freq: Four times a day (QID) | OROMUCOSAL | Status: DC | PRN

## 2024-10-13 NOTE — Group Note (Signed)
 Date:  10/13/2024 Time:  3:52 PM  Group Topic/Focus: Motivation/Inspiration/Stages of Change Stages of Change:   The focus of this group is to explain the stages of change and help patients identify changes they want to make upon discharge.    Participation Level:  Active  Participation Quality:  Appropriate and Monopolizing  Affect:  Anxious and Appropriate  Cognitive:  Appropriate  Insight: Appropriate  Engagement in Group:  Engaged  Modes of Intervention:  Discussion  Additional Comments:  Pt engaged appropriately during group.  Yehia Mcbain D Zamia Tyminski 10/13/2024, 3:52 PM

## 2024-10-13 NOTE — Progress Notes (Signed)
 Pt verbalized unrelieved tooth pain. He stated tylenol  does not help and he dislike ibuprofen. Pt asked if he takes other medication for his teeth pain such as oral gel and he stated he takes percocet.   Pt educated percocet is not commonly given for teeth pain at this facility. He can be given tylenol  again when its due.

## 2024-10-13 NOTE — Progress Notes (Signed)
 Pt is due to be discharged tomorrow. Pt states that if we discharge him that he will slit his wrists. Pt is homeless and doesn't like his choices of where he can be discharged to. MD and treatment team has been notified. Will continue to monitor.

## 2024-10-13 NOTE — Group Note (Signed)
 Date:  10/13/2024 Time:  3:22 PM  Group Topic/Focus: Spiritual Wellness with Chaplain    Pt did attend spiritual wellness group with the chaplain  Javier Glover R [name] 10/13/2024, 3:22 PM

## 2024-10-13 NOTE — Plan of Care (Signed)
   Problem: Education: Goal: Emotional status will improve Outcome: Not Progressing Goal: Mental status will improve Outcome: Not Progressing Goal: Verbalization of understanding the information provided will improve Outcome: Not Progressing

## 2024-10-13 NOTE — Progress Notes (Signed)
 [facility] Inpatient Psychiatry Progress Note  Date: 10/13/2024 Patient: Javier Glover MRN: [id number]  Assessment and Plan: Javier Glover is a 38 y.o. male with a past psychiatric history of AUD, MDD, stimulant use disorder (amphetamine and cocaine) bipolar 1 disorder and antisocial behavior. Patient initially arrived to [facility] on 12/10 for SI and was admitted to [facility] on 12/10 for crisis stabalization and substance related issues.  Continuing to explore inpatient rehab options as this is most inline with his request for an extended inpatient program. Over the course of this hospitalization suspicion for an element of secondary gain became more and more apparent, though he would ultimately benefit from SUD treatment.   12/17: Patient endorsing SI with a plan to cut his wrists under the condition that he is discharged from the hospital.  Patient has been vague and evasive with any attempts providers make to understand where his SI is coming from.  His suicidal ideation is not currently from psychiatric pathology, likely secondary gain given conditionality of SI.  Planning for discharge to [facility] house or shelter tomorrow.  # Mood disorder - MDD vs substance induced mood disorder - Moderate to severe, with suicidal ideation - continue sertraline  100 mg daily   # AUD, stimulant use disorder - Thiamine  100 mg daily  # sore throat & cough - Continue Chloraseptic mouth spray as needed - Continue Cepacol lozenges as needed - Continue Mucinex  dm twice daily - respiratory panel -> negative  # tooth ache - Orajel 4 times daily as needed  # GERD - Increase pantoprazole  to 40 mg twice daily - Continue Tums as needed daily  # nocturnal hot flashes - Continue 0.2 clonidine  QHS  Risk Assessment - High  Discharge Planning Barriers to discharge: dispo planning Estimated length of stay: 1 day Predicted Discharge location: [facility] vs shelter  Interval  History and update: Patient continues to endorse vague, conditional suicidal ideation on assessment, and does not want to speak with me. I once again attempted to discuss with patient his suicidal thoughts, however he continues to say I have a whole lot on my mind and that he is going through family issues with his sister.  Discussed with patient that, again, this does not really give me more insight to his suicidal thoughts and why they are occurring.  I asked patient if they have gotten any better or worse throughout this admission, and patient states that they have been getting worse.  Patient is unable to describe how they have been getting worse, I asked patient how frequently he has these thoughts each day, and he says they are always there.  Discussed with patient that he has reached maximal benefit from this hospitalization, and we are planning for discharge tomorrow.  Patient says that he does not want to go to a shelter, he says that he cannot be around other people that do drugs, and patient refuses [facility], he says that they wake up too early.  Discussed with patient that [facility] house and shelters are the only options as he needs to remain in [facility] per the limitations of hips per patient, and the other shelters he was interested in denied his insurance.  Patient was then given additional list of numbers to call and reach out for placement pending discharge tomorrow, though has not made any calls at this time.  Patient then requesting to speak to providers again, and made several statements demonstrating secondary gain.  Patient says tell  them not to clean my room, I will be right back here tomorrow and I will be back here with my wrist cut.    Physical Exam MSK/Neuro - Normal gait and station  Mental Status Exam Appearance - Casually dressed, appropriate hygiene and grooming  Attitude - demanding Speech - normal tone, inflection, increased volume Mood - labile Affect  -restricted on assessment though bright, laughing appropriately with peers in the milieu Thought Process - LLGD Thought Content - No delusional TC expressed SI/HI - endorses conditional SI Perceptions - Denies AVH; not RIS Judgement/Insight - [employer] of knowledge - WNL Language - No impairments  Lab Results:  Admission on 10/06/2024  Component Date Value Ref Range Status   TSH 10/07/2024 1.260  0.350 - 4.500 uIU/mL Final   Cholesterol 10/07/2024 197  0 - 200 mg/dL Final   Triglycerides 87/88/7974 88  <150 mg/dL Final   HDL 87/88/7974 45  >40 mg/dL Final   Total CHOL/HDL Ratio 10/07/2024 4.4  RATIO Final   VLDL 10/07/2024 18  0 - 40 mg/dL Final   LDL Cholesterol 10/07/2024 134 (H)  0 - 99 mg/dL Final   Hgb J8r MFr Bld 10/07/2024 5.8 (H)  4.8 - 5.6 % Final   Mean Plasma Glucose 10/07/2024 119.76  mg/dL Final   SARS Coronavirus 2 by RT PCR 10/10/2024 NEGATIVE  NEGATIVE Final   Influenza A by PCR 10/10/2024 NEGATIVE  NEGATIVE Final   Influenza B by PCR 10/10/2024 NEGATIVE  NEGATIVE Final   Resp Syncytial Virus by PCR 10/10/2024 NEGATIVE  NEGATIVE Final    Vitals: Blood pressure 124/75, pulse 84, temperature (!) 97.5 F (36.4 C), temperature source Oral, resp. rate 16, height 5' 9 (1.753 m), weight 85.3 kg, SpO2 100%.   Alfornia Light, DO

## 2024-10-13 NOTE — Group Note (Signed)
 Date:  10/13/2024 Time:  12:46 PM  Group Topic/Focus: Pharmacy    Pt did attend pharmacy group  Javier Glover 10/13/2024, 12:46 PM

## 2024-10-13 NOTE — BH IP Treatment Plan (Signed)
 Interdisciplinary Treatment and Diagnostic Plan Update  10/13/2024 Time of Session: 11:00 AM - UPDATE Javier Glover MRN: [id number]  Principal Diagnosis: Mood disorder  Secondary Diagnoses: Principal Problem:   Mood disorder   Current Medications:  Current Facility-Administered Medications  Medication Dose Route Frequency Provider Last Rate Last Admin   acetaminophen  (TYLENOL ) tablet 650 mg  650 mg Oral Q6H PRN White, Patrice L, NP   650 mg at 10/13/24 0744   alum & mag hydroxide-simeth (MAALOX/MYLANTA) 200-200-20 MG/5ML suspension 30 mL  30 mL Oral Q4H PRN White, Patrice L, NP   30 mL at 10/11/24 1815   benzocaine  (ORAJEL) 10 % mucosal gel   Mouth/Throat QID PRN Faunce, Alina, DO       calcium  carbonate (TUMS - dosed in mg elemental calcium ) chewable tablet 200 mg of elemental calcium   1 tablet Oral PRN Faunce, Alina, DO       cloNIDine  (CATAPRES ) tablet 0.2 mg  0.2 mg Oral QHS Faunce, Alina, DO   0.2 mg at 10/12/24 2114   dextromethorphan -guaiFENesin  (MUCINEX  DM) 30-600 MG per 12 hr tablet 1 tablet  1 tablet Oral BID Prentis Kitchens A, DO   1 tablet at 10/12/24 1715   haloperidol  (HALDOL ) tablet 5 mg  5 mg Oral TID PRN Teresa Jes L, NP   5 mg at 10/09/24 1710   And   diphenhydrAMINE  (BENADRYL ) capsule 50 mg  50 mg Oral TID PRN Teresa Jes L, NP   50 mg at 10/09/24 1710   haloperidol  lactate (HALDOL ) injection 5 mg  5 mg Intramuscular TID PRN White, Patrice L, NP       And   diphenhydrAMINE  (BENADRYL ) injection 50 mg  50 mg Intramuscular TID PRN White, Patrice L, NP       And   LORazepam  (ATIVAN ) injection 2 mg  2 mg Intramuscular TID PRN White, Patrice L, NP       haloperidol  lactate (HALDOL ) injection 10 mg  10 mg Intramuscular TID PRN White, Patrice L, NP       And   diphenhydrAMINE  (BENADRYL ) injection 50 mg  50 mg Intramuscular TID PRN White, Patrice L, NP       And   LORazepam  (ATIVAN ) injection 2 mg  2 mg Intramuscular TID PRN White, Patrice L, NP       feeding  supplement (ENSURE PLUS HIGH PROTEIN) liquid 237 mL  237 mL Oral BID BM Faunce, Alina, DO   237 mL at 10/13/24 0823   hydrOXYzine  (ATARAX ) tablet 50 mg  50 mg Oral TID PRN Prentis Kitchens A, DO   50 mg at 10/12/24 2114   magnesium  hydroxide (MILK OF MAGNESIA) suspension 30 mL  30 mL Oral Daily PRN White, Patrice L, NP       menthol  (CEPACOL) lozenge 3 mg  1 lozenge Oral PRN Faunce, Alina, DO   3 mg at 10/10/24 0741   multivitamin with minerals tablet 1 tablet  1 tablet Oral Daily White, Patrice L, NP   1 tablet at 10/13/24 0744   nicotine  (NICODERM CQ  - dosed in mg/24 hours) patch 14 mg  14 mg Transdermal Daily Faunce, Alina, DO   14 mg at 10/09/24 0847   ondansetron  (ZOFRAN -ODT) disintegrating tablet 4 mg  4 mg Oral Q6H PRN Butler, Laura N, MD   4 mg at 10/10/24 0825   pantoprazole  (PROTONIX ) EC tablet 40 mg  40 mg Oral BID Faunce, Alina, DO   40 mg at 10/13/24 0744   phenol (CHLORASEPTIC) mouth  spray 2 spray  2 spray Mouth/Throat PRN Faunce, Alina, DO   2 spray at 10/07/24 2052   sertraline  (ZOLOFT ) tablet 100 mg  100 mg Oral Daily Faunce, Alina, DO   100 mg at 10/13/24 0744   thiamine  (Vitamin B-1) tablet 100 mg  100 mg Oral Daily White, Patrice L, NP   100 mg at 10/13/24 0744   traZODone  (DESYREL ) tablet 50 mg  50 mg Oral QHS PRN White, Patrice L, NP   50 mg at 10/12/24 2114   PTA Medications: Medications Prior to Admission  Medication Sig Dispense Refill Last Dose/Taking   citalopram  (CELEXA ) 10 MG tablet Take 1 tablet (10 mg total) by mouth daily. (Patient not taking: Reported on 09/03/2021) 30 tablet 1    omeprazole  (PRILOSEC) 10 MG capsule Take 10 mg by mouth daily. (Patient not taking: Reported on 09/03/2021)      ondansetron  (ZOFRAN ) 4 MG tablet Take 1 tablet (4 mg total) by mouth every 8 (eight) hours as needed for nausea, vomiting or refractory nausea / vomiting. (Patient not taking: Reported on 09/03/2021) 21 tablet 0    ondansetron  (ZOFRAN -ODT) 4 MG disintegrating tablet Take 1 tablet (4  mg total) by mouth every 8 (eight) hours as needed for nausea or vomiting. (Patient not taking: Reported on 10/06/2024) 20 tablet 0     Patient Stressors: Financial difficulties   Medication change or noncompliance   Substance abuse    Patient Strengths: Automotive Engineer for treatment/growth  Supportive family/friends   Treatment Modalities: Medication Management, Group therapy, Case management,  1 to 1 session with clinician, Psychoeducation, Recreational therapy.   Physician Treatment Plan for Primary Diagnosis: Mood disorder Long Term Goal(s):     Short Term Goals: Ability to disclose and discuss suicidal ideas Ability to identify triggers associated with substance abuse/mental health issues will improve  Medication Management: Evaluate patient's response, side effects, and tolerance of medication regimen.  Therapeutic Interventions: 1 to 1 sessions, Unit Group sessions and Medication administration.  Evaluation of Outcomes: Progressing  Physician Treatment Plan for Secondary Diagnosis: Principal Problem:   Mood disorder  Long Term Goal(s):     Short Term Goals: Ability to disclose and discuss suicidal ideas Ability to identify triggers associated with substance abuse/mental health issues will improve     Medication Management: Evaluate patient's response, side effects, and tolerance of medication regimen.  Therapeutic Interventions: 1 to 1 sessions, Unit Group sessions and Medication administration.  Evaluation of Outcomes: Progressing   RN Treatment Plan for Primary Diagnosis: Mood disorder Long Term Goal(s): Knowledge of disease and therapeutic regimen to maintain health will improve  Short Term Goals: Ability to remain free from injury will improve, Ability to verbalize frustration and anger appropriately will improve, Ability to verbalize feelings will improve, and Ability to disclose and discuss suicidal ideas  Medication Management: RN  will administer medications as ordered by provider, will assess and evaluate patient's response and provide education to patient for prescribed medication. RN will report any adverse and/or side effects to prescribing provider.  Therapeutic Interventions: 1 on 1 counseling sessions, Psychoeducation, Medication administration, Evaluate responses to treatment, Monitor vital signs and CBGs as ordered, Perform/monitor CIWA, COWS, AIMS and Fall Risk screenings as ordered, Perform wound care treatments as ordered.  Evaluation of Outcomes: Progressing   LCSW Treatment Plan for Primary Diagnosis: Mood disorder Long Term Goal(s): Safe transition to appropriate next level of care at discharge, Engage patient in therapeutic group addressing interpersonal concerns.  Short Term  Goals: Engage patient in aftercare planning with referrals and resources, Increase social support, Facilitate acceptance of mental health diagnosis and concerns, and Facilitate patient progression through stages of change regarding substance use diagnoses and concerns  Therapeutic Interventions: Assess for all discharge needs, 1 to 1 time with Social worker, Explore available resources and support systems, Assess for adequacy in community support network, Educate family and significant other(s) on suicide prevention, Complete Psychosocial Assessment, Interpersonal group therapy.  Evaluation of Outcomes: Progressing   Progress in Treatment: Attending groups: attended some groups Participating in groups: Yes. Taking medication as prescribed: Yes. Toleration medication: Yes. Family/Significant other contact made: No, will contact:  Damien Dayhoff (p.o.) [phone] 1st attempt 12/12 @ 1:35 pm, Date and time of second attempt: 10/10/24/3:23 pm  Patient understands diagnosis: Yes. Discussing patient identified problems/goals with staff: Yes. Medical problems stabilized or resolved: Yes. Denies suicidal/homicidal ideation:  Yes. Issues/concerns per patient self-inventory: No.   New problem(s) identified:  No   New Short Term/Long Term Goal(s):     medication stabilization, elimination of SI thoughts, development of comprehensive mental wellness plan.    Patient Goals:  I want to go to a long-term substance use program.   Discharge Plan or Barriers:  Patient recently admitted. CSW will continue to follow and assess for appropriate referrals and possible discharge planning.      Reason for Continuation of Hospitalization: Medication stabilization Suicidal ideation   Estimated Length of Stay:  1 - 2 days  Last 3 [place] Suicide Severity Risk Score: Flowsheet Row [facility] (Current[facility] Most recent reading at 10/06/2024 10:30 PM ED from 10/06/2024 in [facility] Department at [facility] Most recent reading at 10/06/2024  5:30 AM ED from 01/29/2024 in [facility] Emergency Department at [facility] Most recent reading at 01/29/2024 11:27 AM  C-SSRS RISK CATEGORY High Risk High Risk No Risk    Last PHQ 2/9 Scores:     No data to display          Scribe for Treatment Team: Milayna Rotenberg O Vylet Maffia, LCSWA 10/13/2024 11:09 AM

## 2024-10-13 NOTE — [facility] Suicide Risk Assessment (Signed)
 Suicide Risk Assessment  Discharge Assessment     [facility] Discharge Suicide Risk Assessment  Principal Problem: Mood disorder Discharge Diagnoses: Principal Problem:   Mood disorder  Musculoskeletal: Strength & Muscle Tone: within normal limits Gait & Station: normal Patient leans: N/A  Psychiatric Specialty Exam  Presentation  General Appearance:  Appropriate for Environment  Eye Contact: Fair  Speech: Normal Rate  Speech Volume: Normal  Handedness:No data recorded  Mood and Affect  Mood: Dysphoric  Duration of Depression Symptoms: No data recorded Affect: Restricted   Thought Process  Thought Processes: Goal Directed; Linear  Descriptions of Associations:Intact  Orientation:Full (Time, Place and Person)  Thought Content:Logical  History of Schizophrenia/Schizoaffective disorder:No data recorded Duration of Psychotic Symptoms:No data recorded Hallucinations:No data recorded Ideas of Reference:None  Suicidal Thoughts:No data recorded Homicidal Thoughts:No data recorded  Sensorium  Memory: Immediate Fair  Judgment: Fair  Insight: Fair   Art Therapist  Concentration: Fair  Attention Span: Fair  Recall: [employer] of Knowledge: Fair  Language: Fair   Psychomotor Activity  Psychomotor Activity:No data recorded  Assets  Assets: Communication Skills; Physical Health; Resilience   Sleep  Sleep:No data recorded Estimated Sleeping Duration (Last 24 Hours): 5.50-6.50 hours  Physical Exam: Physical Exam ROS Blood pressure 129/69, pulse 90, temperature (!) 97.5 F (36.4 C), temperature source Oral, resp. rate 16, height 5' 9 (1.753 m), weight 85.3 kg, SpO2 98%. Body mass index is 27.76 kg/m.  Mental Status Per Nursing Assessment::   On Admission:  Self-harm thoughts  Demographic Factors:  Male and Unemployed  Loss Factors: Legal issues and Financial problems/change in socioeconomic status  Historical  Factors: Prior suicide attempts and Impulsivity  Risk Reduction Factors:   Positive coping skills or problem solving skills  Continued Clinical Symptoms:  Alcohol/Substance Abuse/Dependencies Personality Disorders:   Cluster B Comorbid alcohol abuse/dependence Previous Psychiatric Diagnoses and Treatments  Cognitive Features That Contribute To Risk:  None    Suicide Risk:  Moderate:  Frequent suicidal ideation with limited intensity, and duration, some specificity in terms of plans, no associated intent, good self-control, limited dysphoria/symptomatology, some risk factors present, and identifiable protective factors, including available and accessible social support.  This patient is moderate suicide risk given frequency of SI and history of prior attempts, though has protective factors of positive social support and coping skills.   Follow-up Information     [facility] Follow up on 10/18/2024.   Why: You have a hospital follow up appointment for therapy and medication management services on 12/22 @ 11:30 am.  The appointment will be Virtual, telehealth. Contact information: [address]  [address] [place] [address] [phone]                 Plan Of Care/Follow-up recommendations:  Activity: as tolerated  Diet: heart healthy  Other: -Follow-up with your outpatient psychiatric provider -instructions on appointment date, time, and address (location) are provided to you in discharge paperwork.  -Take your psychiatric medications as prescribed at discharge - instructions are provided to you in the discharge paperwork  -Follow-up with outpatient primary care doctor and other specialists -for management of chronic medical disease, including: elevated LDL  -Testing: Follow-up with outpatient provider for abnormal lab results: a1c 5.8  -Recommend abstinence from alcohol, tobacco, and other illicit drug use at discharge.   -If your psychiatric symptoms recur,  worsen, or if you have side effects to your psychiatric medications, call your outpatient psychiatric provider, 911, [phone] or go to the nearest emergency department.  -  If suicidal thoughts recur, call your outpatient psychiatric provider, 911, 988 or go to the nearest emergency department.   Alfornia Light, DO 10/13/2024, 8:38 PM

## 2024-10-13 NOTE — Group Note (Signed)
 Date:  10/13/2024 Time:  10:00 AM  Group Topic/Focus:  Goals Group:   The focus of this group is to help patients establish daily goals to achieve during treatment and discuss how the patient can incorporate goal setting into their daily lives to aide in recovery. Orientation:   The focus of this group is to educate the patient on the purpose and policies of crisis stabilization and provide a format to answer questions about their admission.  The group details unit policies and expectations of patients while admitted.    Participation Level:  Active  Participation Quality:  Appropriate and Sharing  Affect:  Appropriate  Cognitive:  Appropriate  Insight: Appropriate  Engagement in Group:  Engaged  Modes of Intervention:  Activity and Discussion  Additional Comments:  n/a  Javier Glover R [name] 10/13/2024, 10:00 AM

## 2024-10-13 NOTE — Group Note (Signed)
 Recreation Therapy Group Note   Group Topic:Communication  Group Date: 10/13/2024 Start Time: 0935 End Time: 0956 Facilitators: Makensey Rego-McCall, LRT,CTRS Location: [address] Dayroom   Group Topic: Communication, Team Building, Problem Solving  Goal Area(s) Addresses:  Patient will effectively work with peer towards shared goal.  Patient will identify skills used to make activity successful.  Patient will identify how skills used during activity can be applied to reach post d/c goals.   Behavioral Response:   Intervention: STEM Activity- Glass Blower/designer  Activity: Tallest [employer]. In teams of 5-6, patients were given 11 craft pipe cleaners. Using the materials provided, patients were instructed to compete again the opposing team(s) to build the tallest free-standing structure from floor level. The activity was timed; difficulty increased by clinical research associate as production designer, theatre/television/film continued.  Systematically resources were removed with additional directions for example, placing one arm behind their back, working in silence, and shape stipulations. LRT facilitated post-activity discussion reviewing team processes and necessary communication skills involved in completion. Patients were encouraged to reflect how the skills utilized, or not utilized, in this activity can be incorporated to positively impact support systems post discharge.  Education: Pharmacist, Community, Scientist, Physiological, Discharge Planning   Education Outcome: Acknowledges education/In group clarification offered/Needs additional education.    Affect/Mood: N/A   Participation Level: Did not attend    Clinical Observations/Individualized Feedback:      Plan: Continue to engage patient in RT group sessions 2-3x/week.   Marriah Sanderlin-McCall, LRT,CTRS 10/13/2024 12:18 PM

## 2024-10-13 NOTE — Group Note (Signed)
 Date:  10/13/2024 Time:  10:13 AM  Group Topic/Focus: Recreational Therapy    Pt did not attend recreational therapy group  Samy Ryner R Raiyah Speakman 10/13/2024, 10:13 AM

## 2024-10-13 NOTE — Discharge Summary (Signed)
 Physician Discharge Summary Note  Patient:  Javier Glover is an 38 y.o., male MRN:  [id number] DOB:  November 25, 1985 Patient phone:  [phone] (home)  Patient address:   [facility] [address] [place] [address],   Date of Admission:  10/06/2024 Date of Discharge: 10/14/2024  Reason for Admission:  ***copy/paste one-liner here  Discharge Diagnoses:  Principal Problem:   Mood disorder   Hospital Course:    Upon admission, the patient presented with *** and was continued on/started on the following medications: ***. Initial medication adjustments included ***, and baseline labs and imaging were reviewed, with the following abnormalities noted for follow-up: ***.  Since admission, patient was requesting we transfer him to a long-term psychiatric facility, preferentially 9 months.  The treatment plan was reviewed daily in interdisciplinary meetings. Ongoing medication management resulted in further adjustments: ***, with final dosing optimized prior to discharge. The patient denies any side effects to prescribed psychiatric medication. The patient engaged in group programming focusing on coping skills, problem-solving, relaxation techniques, and also received supportive psychotherapy.  Over the course of hospitalization, the patient acclimated to the unit milieu and showed steady improvement in mood, affect, sleep, appetite, and participation in programming. Daily self-inventories reflected ongoing symptom reduction and increased treatment engagement.  Leading up to discharge, the patient denied hallucinations or other psychotic symptoms.  Throughout this patient's admission, he continued to express vague suicidal ideation though continuously declining to speak with his treatment team about these.  These suicidal ideations intensified getting closer to discharge, with patient conditionally suicidal when discussing discharge.  On day of discharge, patient reported ***.  The patient expressed motivation to continue prescribed medications and follow-up care, noting good response of target symptoms of *** and overall benefit from hospitalization. The patient was able to verbalize an individualized safety plan prior to discharge.   Mental Status Exam: Appearance:  Behavior:  Attitude:  Speech:  Mood:  Affect:  Thought Process:  Thought Content: SI/HI:  Perceptions:  Judgement:   Insight:   Fund of Knowledge:   Physical Exam *** General: Pleasant, well-appearing ***. No acute distress. Pulmonary: Normal effort on room air.  Skin: No obvious rash or lesions. Neuro: A&Ox3.No focal deficits. MSK: Normal gait and station.   Review of Systems *** No reported symptoms  Blood pressure 129/69, pulse 90, temperature (!) 97.5 F (36.4 C), temperature source Oral, resp. rate 16, height 5' 9 (1.753 m), weight 85.3 kg, SpO2 98%. Body mass index is 27.76 kg/m.  Assets  Assets: ***  Tobacco Use History[1] Tobacco Cessation:  {Discharge tobacco cessation prescription:304700209}  Metabolic Disorder Labs:  Lab Results  Component Value Date   HGBA1C 5.8 (H) 10/07/2024   MPG 119.76 10/07/2024   No results found for: PROLACTIN Lab Results  Component Value Date   CHOL 197 10/07/2024   TRIG 88 10/07/2024   HDL 45 10/07/2024   CHOLHDL 4.4 10/07/2024   VLDL 18 10/07/2024   LDLCALC 134 (H) 10/07/2024   LDLCALC 126 (H) 08/10/2019     Is patient on multiple antipsychotic therapies at discharge:  {RECOMMEND TAPERING:22617}   Has Patient had three or more failed trials of antipsychotic monotherapy by history:  {[facility] ANTIPSYCHOTIC:22903}  Recommended Plan for Multiple Antipsychotic Therapies: {[facility] MULTIPLE ANTIPSYCHOTIC THERAPIES:22905}   Allergies as of 10/13/2024       Reactions   Ibuprofen Rash     Med Rec must be completed prior to using this [facility]***       Follow-up  Information     [facility] Follow up on 10/18/2024.   Why: You  have a hospital follow up appointment for therapy and medication management services on 12/22 @ 11:30 am.  The appointment will be Virtual, telehealth. Contact information: [address]  [address] [place] [address] [phone]                 Discharge recommendations:  Continue psychiatric medications as prescribed. Follow up with outpatient psychiatric provider and primary care physician as scheduled. Follow-up for abnormal lab results: elevated LDL, a1c Follow-up for management of chronic disease: n/a Abstain from or limit alcohol, illicit drugs, and tobacco due to their negative impact on psychiatric and medical health. In the event of worsening symptoms, the patient is instructed to call the national crisis hotline (988), 911, or go the the closest ED for appropriate evaluation and treatment of symptoms.   Alfornia Light, DO, PGY-1 10/13/2024, 8:43 PM       [1]  Social History Tobacco Use  Smoking Status Every Day   Current packs/day: 0.50   Average packs/day: 0.5 packs/day for 14.0 years (7.0 ttl pk-yrs)   Types: Cigarettes  Smokeless Tobacco Never  Tobacco Comments   12/29/19 - currently smokes 1 pack/week

## 2024-10-13 NOTE — [facility] Group Notes (Signed)
 [facility] Group Notes:  (Nursing/MHT/Case Management/Adjunct)  Date:  10/13/2024  Time:  9:22 PM  Type of Therapy:  NA group  Participation Level:  Minimal  Participation Quality:  Appropriate  Affect:  Appropriate  Cognitive:  Appropriate  Insight:  Appropriate  Engagement in Group:  Engaged  Modes of Intervention:  Education  Summary of Progress/Problems: Attended NA meeting.  Grayce LITTIE Essex 10/13/2024, 9:22 PM

## 2024-10-14 ENCOUNTER — Other Ambulatory Visit: Payer: Self-pay

## 2024-10-14 ENCOUNTER — Emergency Department (HOSPITAL_COMMUNITY)
Admission: EM | Admit: 2024-10-14 | Discharge: 2024-10-14 | Disposition: A | Discharge status: Home / Self Care | Payer: MEDICAID | Source: Home / Self Care | Attending: Emergency Medicine | Admitting: Emergency Medicine

## 2024-10-14 ENCOUNTER — Ambulatory Visit (HOSPITAL_COMMUNITY)
Admission: EM | Admit: 2024-10-14 | Discharge: 2024-10-14 | Disposition: A | Discharge status: Home / Self Care | Payer: MEDICAID | Attending: Psychiatry | Admitting: Psychiatry

## 2024-10-14 DIAGNOSIS — R45851 Suicidal ideations: Secondary | ICD-10-CM | POA: Insufficient documentation

## 2024-10-14 DIAGNOSIS — F17210 Nicotine dependence, cigarettes, uncomplicated: Secondary | ICD-10-CM | POA: Insufficient documentation

## 2024-10-14 DIAGNOSIS — Z5900 Homelessness unspecified: Secondary | ICD-10-CM | POA: Diagnosis present

## 2024-10-14 DIAGNOSIS — Z72811 Adult antisocial behavior: Secondary | ICD-10-CM | POA: Insufficient documentation

## 2024-10-14 DIAGNOSIS — Z765 Malingerer [conscious simulation]: Principal | ICD-10-CM

## 2024-10-14 DIAGNOSIS — F319 Bipolar disorder, unspecified: Secondary | ICD-10-CM | POA: Insufficient documentation

## 2024-10-14 DIAGNOSIS — Z5901 Sheltered homelessness: Secondary | ICD-10-CM | POA: Insufficient documentation

## 2024-10-14 DIAGNOSIS — F1990 Other psychoactive substance use, unspecified, uncomplicated: Secondary | ICD-10-CM

## 2024-10-14 DIAGNOSIS — F1490 Cocaine use, unspecified, uncomplicated: Secondary | ICD-10-CM | POA: Insufficient documentation

## 2024-10-14 MED ORDER — PANTOPRAZOLE SODIUM 40 MG PO TBEC: 40.0000 mg | DELAYED_RELEASE_TABLET | Freq: Two times a day (BID) | ORAL | 0 refills | Status: DC

## 2024-10-14 MED ORDER — TRAZODONE HCL 50 MG PO TABS: 50.0000 mg | ORAL_TABLET | Freq: Every evening | ORAL | 0 refills | Status: DC | PRN

## 2024-10-14 MED ORDER — NICOTINE 14 MG/24HR TD PT24: 14.0000 mg | MEDICATED_PATCH | Freq: Every day | TRANSDERMAL | 0 refills | Status: AC

## 2024-10-14 MED ORDER — SERTRALINE HCL 100 MG PO TABS: 100.0000 mg | ORAL_TABLET | Freq: Every day | ORAL | 0 refills | Status: DC

## 2024-10-14 MED ORDER — HYDROXYZINE HCL 50 MG PO TABS: 50.0000 mg | ORAL_TABLET | Freq: Three times a day (TID) | ORAL | 0 refills | Status: AC | PRN

## 2024-10-14 MED ORDER — VITAMIN B-1 100 MG PO TABS: 100.0000 mg | ORAL_TABLET | Freq: Every day | ORAL | 0 refills | Status: AC

## 2024-10-14 NOTE — ED Notes (Signed)
 Pt wanded by security.

## 2024-10-14 NOTE — Progress Notes (Signed)
°  [facility] Adult Case Management Discharge Plan :  Will you be returning to the same living situation after discharge:  Yes,  Pt will return to [facility]. At discharge, do you have transportation home?: Yes,  Pt will be transported via cab. Do you have the ability to pay for your medications: Yes,  Pt has medical insurance.  Release of information consent forms completed and in the chart;  Patient's signature needed at discharge.  Patient to Follow up at:  Follow-up Information     [facility] Follow up on 10/18/2024.   Why: You have a hospital follow up appointment for therapy and medication management services on 12/22 @ 11:30 am.  The appointment will be Virtual, telehealth. Contact information: [address]  [address] [place] [address] [phone]                 Next level of care provider has access to [facility] Link:no  Safety Planning and Suicide Prevention discussed: Yes,  Pt was provided with SPE at discharge.     Has patient been referred to the Quitline?: Patient refused referral for treatment  Patient has been referred for addiction treatment: Patient refused referral for treatment.  Javier JONELLE Blanch, LCSW 10/14/2024, 9:18 AM

## 2024-10-14 NOTE — Progress Notes (Signed)
(  Sleep Hours) - 6.75 (Any PRNs that were needed, meds refused, or side effects to meds)- PRN tylenol  650 mg and trazodone  50 mg given at pt request, no meds refused.  (Any disturbances and when (visitation, over night)- None  (Concerns raised by the patient)- Pt is concerned about discharge and adamantly against it.  (SI/HI/AVH)- Endorses passive SI with no plan, verbally contracts for safety. Denies HI/AVH.

## 2024-10-14 NOTE — Progress Notes (Signed)
°   10/13/24 2118  Psych Admission Type (Psych Patients Only)  Admission Status Voluntary  Psychosocial Assessment  Patient Complaints Anxiety;Depression;Self-harm thoughts  Eye Contact Fair  Facial Expression Animated  Affect Appropriate to circumstance  Speech Logical/coherent  Interaction Assertive  Motor Activity Slow  Appearance/Hygiene Unremarkable  Behavior Characteristics Cooperative  Mood Depressed  Thought Process  Coherency WDL  Content WDL  Delusions None reported or observed  Perception WDL  Hallucination None reported or observed  Judgment Poor  Confusion None  Danger to Self  Current suicidal ideation? Passive  Agreement Not to Harm Self Yes  Description of Agreement Verbal  Danger to Others  Danger to Others None reported or observed

## 2024-10-14 NOTE — Progress Notes (Signed)
 Patient verbalizes he is not ready for discharge. Patient endorsing SI with a plan to cut his wrist. Patient endorsing HI toward no one specific. Patient states he is going to the emergency room once discharged. Patient refused to complete a suicide safety plan. All patient belongings returned to patient. Discharge instructions read and discussed with patient (appointments, medications, resources). Patient discharged to lobby at 1000 where taxi driver was waiting.

## 2024-10-14 NOTE — Discharge Instructions (Addendum)
 Discharge recommendations:   Medications: Patient is to take medications as prescribed. The patient or patient's guardian is to contact a medical professional and/or outpatient provider to address any new side effects that develop. The patient or the patient's guardian should update outpatient providers of any new medications and/or medication changes.    Outpatient Follow up: Please review list of outpatient resources for psychiatry and counseling. Please follow up with your primary care provider for all medical related needs.    Therapy: We recommend that patient participate in individual therapy to address mental health concerns.   Safety:   The following safety precautions should be taken:   No sharp objects. This includes scissors, razors, scrapers, and putty knives.   Chemicals should be removed and locked up.   Medications should be removed and locked up.   Weapons should be removed and locked up. This includes firearms, knives and instruments that can be used to cause injury.   The patient should abstain from use of illicit substances/drugs and abuse of any medications.  If symptoms worsen or do not continue to improve or if the patient becomes actively suicidal or homicidal then it is recommended that the patient return to the closest hospital emergency department, the [facility], or call 911 for further evaluation and treatment. National Suicide Prevention Lifeline 1-800-SUICIDE or 845-834-8875.  About 988 988 offers 24/7 access to trained crisis counselors who can help people experiencing mental health-related distress. People can call or text 988 or chat 988lifeline.org for themselves or if they are worried about a loved one who may need crisis support.

## 2024-10-14 NOTE — ED Provider Notes (Addendum)
 [place] EMERGENCY DEPARTMENT AT [facility] Provider Note   CSN: [id number] Arrival date & time: 10/14/24  1308     Patient presents with: Suicidal   Javier Glover is a 38 y.o. male.   Patient here with ongoing suicidal thoughts but no plan.  He was just discharged from behavioral health inpatient team at [facility] this morning.  Went to behavioral health urgent care for evaluation and then came here.  He is homeless.  He still feels suicidal.  He was found in the parking lot by police threatening self-harm.  The history is provided by the patient.       Prior to Admission medications  Medication Sig Start Date End Date Taking? Authorizing Provider  hydrOXYzine  (ATARAX ) 50 MG tablet Take 1 tablet (50 mg total) by mouth 3 (three) times daily as needed for anxiety. 10/14/24   Faunce, Alina, DO  nicotine  (NICODERM CQ  - DOSED IN MG/24 HOURS) 14 mg/24hr patch Place 1 patch (14 mg total) onto the skin daily. 10/14/24   Faunce, Alina, DO  pantoprazole  (PROTONIX ) 40 MG tablet Take 1 tablet (40 mg total) by mouth 2 (two) times daily. 10/14/24   Faunce, Alfornia, DO  sertraline  (ZOLOFT ) 100 MG tablet Take 1 tablet (100 mg total) by mouth daily. 10/14/24   Faunce, Alina, DO  thiamine  (VITAMIN B-1) 100 MG tablet Take 1 tablet (100 mg total) by mouth daily. 10/14/24   Faunce, Alfornia, DO  traZODone  (DESYREL ) 50 MG tablet Take 1 tablet (50 mg total) by mouth at bedtime as needed for sleep. 10/14/24   Idelle Alfornia, DO    Allergies: Ibuprofen    Review of Systems  Updated Vital Signs BP 128/81   Pulse 96   Temp 97.9 F (36.6 C)   Resp 18   SpO2 97%   Physical Exam Vitals and nursing note reviewed.  Constitutional:      General: He is not in acute distress.    Appearance: He is well-developed. He is not ill-appearing.  HENT:     Head: Normocephalic and atraumatic.     Nose: Nose normal.     Mouth/Throat:     Mouth: Mucous membranes are moist.  Eyes:     Extraocular  Movements: Extraocular movements intact.     Conjunctiva/sclera: Conjunctivae normal.     Pupils: Pupils are equal, round, and reactive to light.  Cardiovascular:     Rate and Rhythm: Normal rate and regular rhythm.     Pulses: Normal pulses.     Heart sounds: Normal heart sounds. No murmur heard. Pulmonary:     Effort: Pulmonary effort is normal. No respiratory distress.     Breath sounds: Normal breath sounds.  Abdominal:     Palpations: Abdomen is soft.     Tenderness: There is no abdominal tenderness.  Musculoskeletal:        General: No swelling.     Cervical back: Normal range of motion and neck supple.  Skin:    General: Skin is warm and dry.     Capillary Refill: Capillary refill takes less than 2 seconds.  Neurological:     Mental Status: He is alert.  Psychiatric:     Comments: Patient appears calm but he does feel suicidal still but no specific plan     (all labs ordered are listed, but only abnormal results are displayed) Labs Reviewed - No data to display  EKG: None  Radiology: No results found.   Procedures   Medications  Ordered in the ED - No data to display                                  Medical Decision Making  Javier Glover is here saying that he is having thoughts of self-harm.  Ultimately is very well-appearing.  Got normal vitals.  He does admit to issues with homelessness.  I have reached out to the behavioral health team that this discharged him from [facility] behavioral health Dr. Raliegh and the behavioral health urgent  NP Rolin Lipps to discuss the patient.  Ultimately inpatient team thought that there was secondary gain and malingering.  Every time they want to discharge him he would endorse SI but otherwise they thought this was more for social reasons.  Overall patient was just discharged from behavioral health this morning he has been evaluated by them and by behavioral health urgent care team already today.  I have reconfirmed to both  teams that they do not think he needs any emergent stabilization's.  They do believe that this is secondary gain and malingering.  I will give him homeless/shelter resources he was given food here.  Patient discharged.  Will have social work/case management talk to him about some resources try to get him a ride somewhere.  This chart was dictated using voice recognition software.  Despite best efforts to proofread,  errors can occur which can change the documentation meaning.      Final diagnoses:  Homelessness    ED Discharge Orders     None          Ruthe Cornet, DO 10/14/24 1448    Ruthe Cornet, DO 10/14/24 1456

## 2024-10-14 NOTE — ED Notes (Signed)
 Pt asked for a way to get to [facility]. Informed that could not be provided. Pt left

## 2024-10-14 NOTE — ED Triage Notes (Addendum)
 Patient Javier Glover, discharged from [facility] hospital and still reporting SI. Found in parking lot by Glover threatening self harm.

## 2024-10-14 NOTE — Plan of Care (Signed)
   Problem: Education: Goal: Emotional status will improve Outcome: Progressing Goal: Mental status will improve Outcome: Progressing Goal: Verbalization of understanding the information provided will improve Outcome: Progressing   Problem: Activity: Goal: Interest or engagement in activities will improve Outcome: Progressing

## 2024-10-14 NOTE — Progress Notes (Signed)
°   10/14/24 1056  [facility] Triage Screening (Walk-ins at [facility] only)  What Is the Reason for Your Visit/Call Today? Javier Glover 38y male arrived to [facility] by [facility]/[facility]. Per pt depo security called [employer] because the pt disclosed that he still felt like killing himself. PT states he just got out of prison 12/4; spent 2 years and 9 months. PT states he is trying to find a rehab place. PT has hx of daily crack use. PT states he stays at [facility] but dislikes the environment; states they smoke crack over there, too much drugs over there. PT endorses SI w/ plan to slit his wrist. PT states he has 9 children and all but 1 is in the system (social services); pt mentions that is his only reason to live but it is hard. PT denies HI, AVH and alcohol and substance use.  How Long Has This Been Causing You Problems? > than 6 months  Have You Recently Had Any Thoughts About Hurting Yourself? Yes  How long ago did you have thoughts about hurting yourself? Today  Are You Planning to Commit Suicide/Harm Yourself At This time? No (Not at the moment, not right now, I feel safe)  Have you Recently Had Thoughts About Hurting Someone Sherral? No  Are You Planning To Harm Someone At This Time? No  Physical Abuse Denies  Verbal Abuse Denies  Sexual Abuse Denies  Exploitation of patient/patient's resources Denies  Self-Neglect Denies  Are you currently experiencing any auditory, visual or other hallucinations? No  Have You Used Any Alcohol or Drugs in the Past 24 Hours? No  Do you have any current medical co-morbidities that require immediate attention? No  Clinician description of patient physical appearance/behavior: calm, cooperative  What Do You Feel Would Help You the Most Today? Treatment for Depression or other mood problem;Medication(s)  Determination of Need Urgent (48 hours)  Options For Referral Intensive Outpatient Therapy;Inpatient Hospitalization;Facility-Based Crisis;Medication Management;Outpatient  Therapy  Determination of Need filed? Yes

## 2024-10-14 NOTE — Group Note (Signed)
 Date:  10/14/2024 Time:  9:56 AM  Group Topic/Focus: Goals group  Patients were given two worksheets: a goals worksheet and a list of 50 positive traits. Patients participated in an icebreaker by sharing their name, a desired Christmas gift, and identifying positive traits they align with. They were encouraged to share their responses and goals with the group. The group focused on promoting expanded thinking, social interaction, and positivity.    Participation Level:  None  Participation Quality:  Inattentive and Monopolizing  Affect:  Anxious, Defensive, and Irritable  Cognitive:  Lacking  Insight: Improving and Lacking  Engagement in Group:  Distracting and None  Modes of Intervention:  Discussion, Exploration, Socialization, and Support  Additional Comments:  Pt attended goals group but did not actively participate. At the start of the group, he stated that staff were discharging him to the streets, expressed that he did not want to leave, and said he planned to call EMS and return after discharge. When worksheets were offered, he declined. Throughout the session, he repeatedly entered and exited the dayroom and appeared disengaged and unfocused on the group activities.  Kristi HERO Ashur Glatfelter 10/14/2024, 9:56 AM

## 2024-10-14 NOTE — ED Provider Notes (Cosign Needed Addendum)
 Behavioral Health Urgent Care Medical Screening Exam  Patient Name: Javier Glover MRN: [id number] Date of Evaluation: 10/14/2024 Chief Complaint: I need to go back to the hospital, they discharged me too early. I am still having suicidal thoughts and need to figure things out   Diagnosis:  Final diagnoses:  Homelessness  Malingering  Substance use   History of Present Illness: Javier Glover, 38 y.o., male with a history of polysubstance use (alcohol, amphetamines, tobacco, and cocaine), BPAD I, antisocial behavior, malingering, and homelessness presented to [facility] voluntarily via BHRT due to suicidal ideation with plan to slit his wrists. Patient seen face to face by this provider, and chart reviewed on 10/14/2024. Per chart review, patient was discharged from [facility] this morning following a one-week admission for SI with plan to cut wrists. Patient presents shortly after discharge requesting readmission. On evaluation, Javier Glover reports he was discharged prematurely discharged as he continues to experience suicidal thoughts and feels he was not provided adequate time to contact resources. He reports suicidal ideation with a plan to cut his wrists with a knife, denies intent as he just wants to return to the hospital. Denies access to knives or firearms. Denies other plans or preparatory behavior. Reports suicidal thoughts have been occurring daily for years. Reports a lot of previous suicide attempts, unable to recall most recent time. Patient unable to state specific psychosocial stressors or articulate symptoms he is experiencing. Patient continues to state I just have to get back to the other hospital to make calls and clear my head. Patient reports diagnoses of BPAD and schizophrenia. Reports he is not currently prescribed psychotropic medications and does not see a psychiatrist or therapist. Reports he currently  resides at [facility] and is unemployed. Denies use of alcohol. Reports daily crack use via smoking, about an 8 ball. Reports first use at age 44, last use about 2 weeks ago. Reports smoking a pack of cigarettes daily. Denies use of other substances.   During evaluation Javier Glover is lying on the floor in no acute distress. He is alert/oriented x 4; cooperative, and is speaking in a clear tone at moderate volume. There is no objective indication that he is currently responding to internal/external stimuli or experiencing delusional thought content. He has denied homicidal ideation, auditory hallucinations, visual hallucinations and/or paranoia.   Flowsheet Row ED from 10/14/2024 in [facility] Most recent reading at 10/14/2024 11:05 AM Ad[facility] (Discharged[facility] Most recent reading at 10/06/2024 10:30 PM ED from 10/06/2024 in [facility] Emergency Department at [facility] Most recent reading at 10/06/2024  5:30 AM  C-SSRS RISK CATEGORY High Risk High Risk High Risk    Psychiatric Specialty Exam  Presentation  General Appearance:Appropriate for Environment  Eye Contact:Fair  Speech:Normal Rate  Speech Volume:Normal   Mood and Affect  Mood: Irritable  Affect: Labile   Thought Process  Thought Processes: Goal Directed  Descriptions of Associations:Intact  Orientation:Full (Time, Place and Person)  Thought Content:Perseveration    Hallucinations:None  Ideas of Reference:None  Suicidal Thoughts:Yes, Active With Plan; Without Means to Carry Out; Without Intent  Homicidal Thoughts:No   Sensorium  Memory: Immediate Fair  Judgment: Poor  Insight: Poor   Executive Functions  Concentration: Fair  Attention Span: Fair  Recall: [employer] of Knowledge: Fair  Language: Fair   Psychomotor Activity  Psychomotor Activity: Normal  Assets   Assets: Manufacturing Systems Engineer; Physical Health   Sleep  Sleep: Fair  Number of hours: No data recorded  Physical Exam: Physical Exam Pulmonary:     Effort: Pulmonary effort is normal.  Musculoskeletal:        General: Normal range of motion.  Neurological:     Mental Status: He is alert and oriented to person, place, and time.  Psychiatric:        Speech: Speech normal.        Behavior: Behavior is cooperative.        Thought Content: Thought content includes suicidal ideation.    Review of Systems  Constitutional: Negative.   HENT: Negative.    Respiratory: Negative.    Musculoskeletal: Negative.   Psychiatric/Behavioral:  Positive for suicidal ideas.    Blood pressure 127/86, pulse (!) 105, temperature 98.9 F (37.2 C), temperature source Temporal, resp. rate 17, SpO2 97%. There is no height or weight on file to calculate BMI.  Musculoskeletal: Strength & Muscle Tone: within normal limits Gait & Station: normal Patient leans: N/A   [facility] MSE Discharge Disposition for Follow up and Recommendations: Review of available records indicates no documented acute change in psychiatric status since discharge earlier today. Discharge summary indicates patient was psychiatrically stabilized and provided with resources and a follow up appointment with [facility]. Per discharge summary, patient refused resources and stated for multiple days that he would go to the ED and report SI with plan to cut wrists so that he could be readmitted to the hospital once discharge was discussed. Discharge provider reported his behavior was consistent with malingering and secondary gain. Current presentation is not consistent with an acute psychiatric condition requiring inpatient hospitalization at this time. Patient's presentation is notable for recurrent requests for inpatient admission immediately following recent discharge, without objective evidence of acute psychiatric deterioration. I need to go  back to the hospital. I need more time to figure out resources and make some calls to get things set up. This statement indicates patient's ability to engage in problem-solving, future oriented thinking, capacity to engage in discharge planning, and ability to independently access resources. Patient's behavior raises concern for secondary gain, including but not limited to, shelter and resources. Per chart review, patient's history is notable for repeated inpatient admissions with subsequent refusal of outpatient resources and lack of follow up with recommended outpatient care. Rehospitalization is unlikely to address underlying needs in absence of outpatient engagement and may reinforce maladaptive coping rather than provide therapeutic benefit. Based on current evaluation, criteria for inpatient psychiatric admission are not met and may reinforce maladaptive coping patterns. Case discussed with attending physician, Dr. Cole, who agrees that inpatient level of care is not indicated at this time and supports discharge with resources.   Javier DELENA Lipps, NP 10/14/2024, 12:14 PM

## 2024-10-23 ENCOUNTER — Other Ambulatory Visit: Payer: Self-pay

## 2024-10-23 ENCOUNTER — Emergency Department (HOSPITAL_COMMUNITY)
Admission: EM | Admit: 2024-10-23 | Discharge: 2024-10-23 | Disposition: A | Discharge status: Home / Self Care | Payer: MEDICAID | Attending: Emergency Medicine | Admitting: Emergency Medicine

## 2024-10-23 ENCOUNTER — Encounter (HOSPITAL_COMMUNITY): Payer: Self-pay | Admitting: Emergency Medicine

## 2024-10-23 DIAGNOSIS — E876 Hypokalemia: Secondary | ICD-10-CM | POA: Diagnosis not present

## 2024-10-23 DIAGNOSIS — R000 Tachycardia, unspecified: Secondary | ICD-10-CM | POA: Insufficient documentation

## 2024-10-23 DIAGNOSIS — R059 Cough, unspecified: Secondary | ICD-10-CM | POA: Diagnosis present

## 2024-10-23 DIAGNOSIS — R051 Acute cough: Secondary | ICD-10-CM | POA: Diagnosis not present

## 2024-10-23 DIAGNOSIS — Z765 Malingerer [conscious simulation]: Secondary | ICD-10-CM | POA: Diagnosis not present

## 2024-10-23 LAB — COMPREHENSIVE METABOLIC PANEL WITH GFR
ALT: 36 U/L (ref 0–44)
AST: 41 U/L (ref 15–41)
Albumin: 4.8 g/dL (ref 3.5–5.0)
Alkaline Phosphatase: 65 U/L (ref 38–126)
Anion gap: 15 (ref 5–15)
BUN: 16 mg/dL (ref 6–20)
CO2: 24 mmol/L (ref 22–32)
Calcium: 9.9 mg/dL (ref 8.9–10.3)
Chloride: 102 mmol/L (ref 98–111)
Creatinine, Ser: 0.75 mg/dL (ref 0.61–1.24)
GFR, Estimated: >60 mL/min
Glucose, Bld: 106 mg/dL — ABNORMAL HIGH (ref 70–99)
Potassium: 3.1 mmol/L — ABNORMAL LOW (ref 3.5–5.1)
Sodium: 142 mmol/L (ref 135–145)
Total Bilirubin: 0.3 mg/dL (ref 0.0–1.2)
Total Protein: 8.1 g/dL (ref 6.5–8.1)

## 2024-10-23 LAB — CBC WITH DIFFERENTIAL/PLATELET
Abs Immature Granulocytes: 0.03 K/uL (ref 0.00–0.07)
Basophils Absolute: 0 K/uL (ref 0.0–0.1)
Basophils Relative: 1 %
Eosinophils Absolute: 0.1 K/uL (ref 0.0–0.5)
Eosinophils Relative: 1 %
HCT: 33.8 % — ABNORMAL LOW (ref 39.0–52.0)
Hemoglobin: 11.3 g/dL — ABNORMAL LOW (ref 13.0–17.0)
Immature Granulocytes: 0 %
Lymphocytes Relative: 22 %
Lymphs Abs: 1.9 K/uL (ref 0.7–4.0)
MCH: 27.1 pg (ref 26.0–34.0)
MCHC: 33.4 g/dL (ref 30.0–36.0)
MCV: 81.1 fL (ref 80.0–100.0)
Monocytes Absolute: 0.8 K/uL (ref 0.1–1.0)
Monocytes Relative: 9 %
Neutro Abs: 5.8 K/uL (ref 1.7–7.7)
Neutrophils Relative %: 67 %
Platelets: 327 K/uL (ref 150–400)
RBC: 4.17 MIL/uL — ABNORMAL LOW (ref 4.22–5.81)
RDW: 13.7 % (ref 11.5–15.5)
WBC: 8.6 K/uL (ref 4.0–10.5)
nRBC: 0 % (ref 0.0–0.2)

## 2024-10-23 LAB — ETHANOL: Alcohol, Ethyl (B): <15 mg/dL

## 2024-10-23 LAB — URINE DRUG SCREEN
Amphetamines: NEGATIVE
Barbiturates: NEGATIVE
Benzodiazepines: NEGATIVE
Cocaine: POSITIVE — AB
Fentanyl: NEGATIVE
Methadone Scn, Ur: NEGATIVE
Opiates: NEGATIVE
Tetrahydrocannabinol: NEGATIVE

## 2024-10-23 LAB — RESP PANEL BY RT-PCR (RSV, FLU A&B, COVID)  RVPGX2
Influenza A by PCR: NEGATIVE
Influenza B by PCR: NEGATIVE
Resp Syncytial Virus by PCR: NEGATIVE
SARS Coronavirus 2 by RT PCR: NEGATIVE

## 2024-10-23 MED ORDER — POTASSIUM CHLORIDE CRYS ER 20 MEQ PO TBCR
40.0000 meq | EXTENDED_RELEASE_TABLET | Freq: Once | ORAL | Status: AC
  Administered 2024-10-23: 40 meq via ORAL
  Filled 2024-10-23: qty 2

## 2024-10-23 NOTE — ED Provider Notes (Signed)
 " [place] EMERGENCY DEPARTMENT AT [facility] Provider Note   CSN: [id number] Arrival date & time: 10/23/24  0403     Patient presents with: Suicidal   Javier Glover is a 38 y.o. male with past medical history seen for alcohol abuse, suicide behavior, cocaine use, amphetamine use, malingering, homelessness who presents with concern for SI, plan to cut himself.  He also endorses dry cough.  Recently discharged from psych hospital and reports that he was going to present back to the ED with same symptoms.  History of multiple visits for same.   HPI     Prior to Admission medications  Medication Sig Start Date End Date Taking? Authorizing Provider  hydrOXYzine  (ATARAX ) 50 MG tablet Take 1 tablet (50 mg total) by mouth 3 (three) times daily as needed for anxiety. 10/14/24   Faunce, Alina, DO  nicotine  (NICODERM CQ  - DOSED IN MG/24 HOURS) 14 mg/24hr patch Place 1 patch (14 mg total) onto the skin daily. 10/14/24   Faunce, Alina, DO  pantoprazole  (PROTONIX ) 40 MG tablet Take 1 tablet (40 mg total) by mouth 2 (two) times daily. 10/14/24   Faunce, Alfornia, DO  sertraline  (ZOLOFT ) 100 MG tablet Take 1 tablet (100 mg total) by mouth daily. 10/14/24   Faunce, Alfornia, DO  thiamine  (VITAMIN B-1) 100 MG tablet Take 1 tablet (100 mg total) by mouth daily. 10/14/24   Faunce, Alfornia, DO  traZODone  (DESYREL ) 50 MG tablet Take 1 tablet (50 mg total) by mouth at bedtime as needed for sleep. 10/14/24   Idelle Alfornia, DO    Allergies: Ibuprofen    Review of Systems  All other systems reviewed and are negative.   Updated Vital Signs BP (!) 142/87 (BP Location: Right Arm)   Pulse (!) 113   Temp 98 F (36.7 C) (Oral)   Resp 18   Ht 5' 9 (1.753 m)   Wt 85.3 kg   SpO2 98%   BMI 27.76 kg/m   Physical Exam Vitals and nursing note reviewed.  Constitutional:      General: He is not in acute distress.    Appearance: Normal appearance.  HENT:     Head: Normocephalic and atraumatic.   Eyes:     General:        Right eye: No discharge.        Left eye: No discharge.  Cardiovascular:     Rate and Rhythm: Regular rhythm. Tachycardia present.     Heart sounds: No murmur heard.    No friction rub. No gallop.     Comments: Mild tachycardia in triage improved on reassessment Pulmonary:     Effort: Pulmonary effort is normal.     Breath sounds: Normal breath sounds.     Comments: Mild dry cough Abdominal:     General: Bowel sounds are normal.     Palpations: Abdomen is soft.  Skin:    General: Skin is warm and dry.     Capillary Refill: Capillary refill takes less than 2 seconds.  Neurological:     Mental Status: He is alert and oriented to person, place, and time.  Psychiatric:        Mood and Affect: Mood normal.        Behavior: Behavior normal.     (all labs ordered are listed, but only abnormal results are displayed) Labs Reviewed  CBC WITH DIFFERENTIAL/PLATELET - Abnormal; Notable for the following components:      Result Value   RBC 4.17 (*)  Hemoglobin 11.3 (*)    HCT 33.8 (*)    All other components within normal limits  COMPREHENSIVE METABOLIC PANEL WITH GFR - Abnormal; Notable for the following components:   Potassium 3.1 (*)    Glucose, Bld 106 (*)    All other components within normal limits  RESP PANEL BY RT-PCR (RSV, FLU A&B, COVID)  RVPGX2  ETHANOL  URINE DRUG SCREEN    EKG: None  Radiology: No results found.   Procedures   Medications Ordered in the ED  potassium chloride  SA (KLOR-CON  M) CR tablet 40 mEq (has no administration in time range)                                    Medical Decision Making Amount and/or Complexity of Data Reviewed Labs: ordered.  Risk Prescription drug management.   Patient is a 38 y.o. male  who presents to the emergency department for psychiatric complaint.  Past Medical History: Alcohol abuse, cocaine use, amphetamine use, homelessness, malingering, bipolar disorder  Physical  Exam: Mild tachycardia on arrival, pulse 113, blood pressure elevated 142/87, vital signs stabilized on reassessment.  Labs: Medical clearance labs ordered, with following pertinent results: CBC overall unremarkable, mild anemia.  Mild hypokalemia CMP, will orally replete.  Overall his vital signs have stabilized as discussed above, again with high clinical suspicion for secondary gain, multiple episodes of being seen for the same in the past, recently cleared by psych, will discharge at this time, do not feel that he needs additional psych eval  Disposition: Overall do not feel that he needs a repeat psych evaluation as he has been seen multiple times for similar in the past, suspect secondary gain, malingering.    Final diagnoses:  Acute cough  Malingering  Hypokalemia    ED Discharge Orders     None          Rosan Sherlean DEL, [place] 10/23/24 9443  "

## 2024-10-23 NOTE — ED Notes (Signed)
 Pt has been dressed out into burgundy scrubs and his clothing and items have been placed in four labeled bags and are stored in triage for now. Security has wanded the patient.

## 2024-10-23 NOTE — ED Triage Notes (Addendum)
 Pt arrives w/ SI w/ plan to cut himself. Reports he wants to die since his mom is dead. Pt also has persistent dry cough.

## 2024-10-23 NOTE — ED Notes (Signed)
 Patient provided with discharge paperwork and bus pass.  Made aware that he is being discharged.  All personal belongings returned to patient at this time

## 2024-12-02 ENCOUNTER — Other Ambulatory Visit: Payer: Self-pay

## 2024-12-02 ENCOUNTER — Telehealth: Payer: MEDICAID | Admitting: Nurse Practitioner

## 2024-12-02 DIAGNOSIS — K219 Gastro-esophageal reflux disease without esophagitis: Secondary | ICD-10-CM

## 2024-12-02 DIAGNOSIS — R112 Nausea with vomiting, unspecified: Secondary | ICD-10-CM

## 2024-12-02 DIAGNOSIS — F39 Unspecified mood [affective] disorder: Secondary | ICD-10-CM

## 2024-12-02 DIAGNOSIS — B353 Tinea pedis: Secondary | ICD-10-CM

## 2024-12-02 MED ORDER — TERBINAFINE HCL 1 % EX CREA
1.0000 | TOPICAL_CREAM | Freq: Two times a day (BID) | CUTANEOUS | 0 refills | Status: AC
  Filled 2024-12-02: qty 30, 15d supply, fill #0

## 2024-12-02 MED ORDER — ONDANSETRON 4 MG PO TBDP
4.0000 mg | ORAL_TABLET | Freq: Three times a day (TID) | ORAL | 0 refills | Status: AC | PRN
  Filled 2024-12-02: qty 20, 7d supply, fill #0

## 2024-12-02 MED ORDER — SERTRALINE HCL 100 MG PO TABS
100.0000 mg | ORAL_TABLET | Freq: Every day | ORAL | 0 refills | Status: AC
  Filled 2024-12-02: qty 30, 30d supply, fill #0

## 2024-12-02 MED ORDER — PANTOPRAZOLE SODIUM 40 MG PO TBEC
40.0000 mg | DELAYED_RELEASE_TABLET | Freq: Two times a day (BID) | ORAL | 0 refills | Status: AC
  Filled 2024-12-02: qty 60, 30d supply, fill #0
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
